# Patient Record
Sex: Male | Born: 1950 | Race: White | Hispanic: No | Marital: Married | Smoking: Current every day smoker
Health system: Southern US, Community
[De-identification: ages and names within clinical notes are randomized; demographics above are authoritative.]

## PROBLEM LIST (undated history)

## (undated) DIAGNOSIS — G4730 Sleep apnea, unspecified: Secondary | ICD-10-CM

## (undated) DIAGNOSIS — E785 Hyperlipidemia, unspecified: Secondary | ICD-10-CM

## (undated) DIAGNOSIS — F32A Depression, unspecified: Secondary | ICD-10-CM

## (undated) DIAGNOSIS — R0600 Dyspnea, unspecified: Secondary | ICD-10-CM

## (undated) DIAGNOSIS — F329 Major depressive disorder, single episode, unspecified: Secondary | ICD-10-CM

## (undated) DIAGNOSIS — I10 Essential (primary) hypertension: Secondary | ICD-10-CM

## (undated) DIAGNOSIS — E119 Type 2 diabetes mellitus without complications: Secondary | ICD-10-CM

## (undated) HISTORY — PX: APPENDECTOMY: SHX54

## (undated) HISTORY — PX: COLONOSCOPY: SHX174

## (undated) HISTORY — PX: HERNIA REPAIR: SHX51

---

## 2007-05-10 ENCOUNTER — Ambulatory Visit: Payer: Self-pay | Admitting: Gastroenterology

## 2008-12-25 ENCOUNTER — Emergency Department: Payer: Self-pay | Admitting: Emergency Medicine

## 2011-09-13 ENCOUNTER — Ambulatory Visit: Payer: Self-pay | Admitting: Gastroenterology

## 2011-09-15 LAB — PATHOLOGY REPORT

## 2014-03-26 DIAGNOSIS — C4492 Squamous cell carcinoma of skin, unspecified: Secondary | ICD-10-CM

## 2014-03-26 DIAGNOSIS — C4491 Basal cell carcinoma of skin, unspecified: Secondary | ICD-10-CM

## 2014-03-26 HISTORY — DX: Squamous cell carcinoma of skin, unspecified: C44.92

## 2014-03-26 HISTORY — DX: Basal cell carcinoma of skin, unspecified: C44.91

## 2016-11-10 ENCOUNTER — Ambulatory Visit
Admission: RE | Admit: 2016-11-10 | Discharge: 2016-11-10 | Disposition: A | Discharge status: Home / Self Care | Payer: Medicare Other | Source: Ambulatory Visit | Attending: Unknown Physician Specialty | Admitting: Unknown Physician Specialty

## 2016-11-10 ENCOUNTER — Ambulatory Visit: Payer: Medicare Other | Admitting: Anesthesiology

## 2016-11-10 ENCOUNTER — Encounter: Payer: Self-pay | Admitting: *Deleted

## 2016-11-10 ENCOUNTER — Encounter
Admission: RE | Disposition: A | Discharge status: Home / Self Care | Payer: Self-pay | Source: Ambulatory Visit | Attending: Unknown Physician Specialty

## 2016-11-10 DIAGNOSIS — Z1211 Encounter for screening for malignant neoplasm of colon: Secondary | ICD-10-CM | POA: Insufficient documentation

## 2016-11-10 DIAGNOSIS — F329 Major depressive disorder, single episode, unspecified: Secondary | ICD-10-CM | POA: Diagnosis not present

## 2016-11-10 DIAGNOSIS — D124 Benign neoplasm of descending colon: Secondary | ICD-10-CM | POA: Diagnosis not present

## 2016-11-10 DIAGNOSIS — D122 Benign neoplasm of ascending colon: Secondary | ICD-10-CM | POA: Insufficient documentation

## 2016-11-10 DIAGNOSIS — D123 Benign neoplasm of transverse colon: Secondary | ICD-10-CM | POA: Insufficient documentation

## 2016-11-10 DIAGNOSIS — F17210 Nicotine dependence, cigarettes, uncomplicated: Secondary | ICD-10-CM | POA: Insufficient documentation

## 2016-11-10 DIAGNOSIS — Z79899 Other long term (current) drug therapy: Secondary | ICD-10-CM | POA: Diagnosis not present

## 2016-11-10 DIAGNOSIS — Z9049 Acquired absence of other specified parts of digestive tract: Secondary | ICD-10-CM | POA: Diagnosis not present

## 2016-11-10 DIAGNOSIS — K648 Other hemorrhoids: Secondary | ICD-10-CM | POA: Diagnosis not present

## 2016-11-10 DIAGNOSIS — I10 Essential (primary) hypertension: Secondary | ICD-10-CM | POA: Insufficient documentation

## 2016-11-10 DIAGNOSIS — D125 Benign neoplasm of sigmoid colon: Secondary | ICD-10-CM | POA: Insufficient documentation

## 2016-11-10 DIAGNOSIS — E785 Hyperlipidemia, unspecified: Secondary | ICD-10-CM | POA: Diagnosis not present

## 2016-11-10 HISTORY — DX: Depression, unspecified: F32.A

## 2016-11-10 HISTORY — PX: COLONOSCOPY WITH PROPOFOL: SHX5780

## 2016-11-10 HISTORY — DX: Hyperlipidemia, unspecified: E78.5

## 2016-11-10 HISTORY — DX: Essential (primary) hypertension: I10

## 2016-11-10 HISTORY — DX: Major depressive disorder, single episode, unspecified: F32.9

## 2016-11-10 SURGERY — COLONOSCOPY WITH PROPOFOL
Anesthesia: General

## 2016-11-10 MED ORDER — PHENYLEPHRINE HCL 10 MG/ML IJ SOLN
INTRAMUSCULAR | Status: DC | PRN
  Administered 2016-11-10: 100 ug via INTRAVENOUS

## 2016-11-10 MED ORDER — SODIUM CHLORIDE 0.9 % IV SOLN: INTRAVENOUS | Status: DC

## 2016-11-10 MED ORDER — PROPOFOL 500 MG/50ML IV EMUL
INTRAVENOUS | Status: DC | PRN
  Administered 2016-11-10: 150 ug/kg/min via INTRAVENOUS

## 2016-11-10 MED ORDER — PROPOFOL 10 MG/ML IV BOLUS
INTRAVENOUS | Status: DC | PRN
  Administered 2016-11-10: 60 mg via INTRAVENOUS

## 2016-11-10 MED ORDER — MIDAZOLAM HCL 2 MG/2ML IJ SOLN
INTRAMUSCULAR | Status: DC | PRN
  Administered 2016-11-10: 1 mg via INTRAVENOUS

## 2016-11-10 MED ORDER — LIDOCAINE HCL (PF) 2 % IJ SOLN
INTRAMUSCULAR | Status: AC
  Filled 2016-11-10: qty 2

## 2016-11-10 MED ORDER — PROPOFOL 500 MG/50ML IV EMUL
INTRAVENOUS | Status: AC
  Filled 2016-11-10: qty 50

## 2016-11-10 MED ORDER — SODIUM CHLORIDE 0.9 % IV SOLN
INTRAVENOUS | Status: DC
  Administered 2016-11-10: 10:00:00 via INTRAVENOUS

## 2016-11-10 NOTE — Anesthesia Preprocedure Evaluation (Signed)
Anesthesia Evaluation  Patient identified by MRN, date of birth, ID band Patient awake    Reviewed: Allergy & Precautions, NPO status , Patient's Chart, lab work & pertinent test results  History of Anesthesia Complications Negative for: history of anesthetic complications  Airway Mallampati: II  TM Distance: >3 FB Neck ROM: Full    Dental no notable dental hx.    Pulmonary neg sleep apnea, neg COPD, Current Smoker,    breath sounds clear to auscultation- rhonchi (-) wheezing      Cardiovascular hypertension, Pt. on medications (-) CAD and (-) Past MI  Rhythm:Regular Rate:Normal - Systolic murmurs and - Diastolic murmurs    Neuro/Psych PSYCHIATRIC DISORDERS Depression negative neurological ROS     GI/Hepatic negative GI ROS, Neg liver ROS,   Endo/Other  negative endocrine ROSneg diabetes  Renal/GU negative Renal ROS     Musculoskeletal negative musculoskeletal ROS (+)   Abdominal (+) + obese,   Peds  Hematology negative hematology ROS (+)   Anesthesia Other Findings Past Medical History: No date: Depression No date: Hyperlipidemia No date: Hypertension   Reproductive/Obstetrics                             Anesthesia Physical Anesthesia Plan  ASA: II  Anesthesia Plan: General   Post-op Pain Management:    Induction: Intravenous  PONV Risk Score and Plan: 0 and Propofol  Airway Management Planned: Natural Airway  Additional Equipment:   Intra-op Plan:   Post-operative Plan:   Informed Consent: I have reviewed the patients History and Physical, chart, labs and discussed the procedure including the risks, benefits and alternatives for the proposed anesthesia with the patient or authorized representative who has indicated his/her understanding and acceptance.   Dental advisory given  Plan Discussed with: CRNA and Anesthesiologist  Anesthesia Plan Comments:          Anesthesia Quick Evaluation

## 2016-11-10 NOTE — Anesthesia Postprocedure Evaluation (Signed)
Anesthesia Post Note  Patient: Jerry Huynh  Procedure(s) Performed: Procedure(s) (LRB): COLONOSCOPY WITH PROPOFOL (N/A)  Patient location during evaluation: Endoscopy Anesthesia Type: General Level of consciousness: awake and alert and oriented Pain management: pain level controlled Vital Signs Assessment: post-procedure vital signs reviewed and stable Respiratory status: spontaneous breathing, nonlabored ventilation and respiratory function stable Cardiovascular status: blood pressure returned to baseline and stable Postop Assessment: no signs of nausea or vomiting Anesthetic complications: no     Last Vitals:  Vitals:   11/10/16 0937 11/10/16 1114  BP: 134/90 (!) 110/91  Pulse: 82 82  Resp: 16 18  Temp: 36.9 C (!) 35.8 C    Last Pain:  Vitals:   11/10/16 1114  TempSrc: Tympanic                 Stepehn Eckard

## 2016-11-10 NOTE — Transfer of Care (Signed)
Immediate Anesthesia Transfer of Care Note  Patient: Jerry Huynh  Procedure(s) Performed: Procedure(s): COLONOSCOPY WITH PROPOFOL (N/A)  Patient Location: PACU  Anesthesia Type:General  Level of Consciousness: awake and alert   Airway & Oxygen Therapy: Patient Spontanous Breathing and Patient connected to nasal cannula oxygen  Post-op Assessment: Report given to RN and Post -op Vital signs reviewed and stable  Post vital signs: Reviewed and stable  Last Vitals:  Vitals:   11/10/16 1114 11/10/16 1117  BP: (!) 110/91 (!) 110/91  Pulse: 82 72  Resp: 18 16  Temp: (!) 35.8 C     Last Pain:  Vitals:   11/10/16 1114  TempSrc: Tympanic         Complications: No apparent anesthesia complications

## 2016-11-10 NOTE — H&P (Signed)
   Primary Care Physician:  [name], MD Primary Gastroenterologist:  Dr. Vira Agar  Pre-Procedure History & Physical: HPI:  Jerry Huynh is a 66 y.o. male is here for an colonoscopy.   Past Medical History:  Diagnosis Date  . Depression   . Hyperlipidemia   . Hypertension     Past Surgical History:  Procedure Laterality Date  . APPENDECTOMY    . COLONOSCOPY    . HERNIA REPAIR      Prior to Admission medications   Medication Sig Start Date End Date Taking? Authorizing Provider  escitalopram (LEXAPRO) 10 MG tablet Take 10 mg by mouth daily.   Yes [provider]  lisinopril-hydrochlorothiazide (PRINZIDE,ZESTORETIC) 20-25 MG tablet Take 1 tablet by mouth daily.   Yes [provider]  metoprolol succinate (TOPROL-XL) 25 MG 24 hr tablet Take 25 mg by mouth daily.   Yes [provider]  Multiple Vitamin (MULTIVITAMIN) tablet Take 1 tablet by mouth daily.   Yes [provider]  Omega-3 1000 MG CAPS Take 1 capsule by mouth daily.   Yes [provider]  omeprazole (PRILOSEC) 20 MG capsule Take 20 mg by mouth daily.   Yes [provider]    Allergies as of 08/14/2016  . (Not on File)    History reviewed. No pertinent family history.  Social History   Social History  . Marital status: Married    Spouse name: N/A  . Number of children: N/A  . Years of education: N/A   Occupational History  . Not on file.   Social History Main Topics  . Smoking status: Current Every Day Smoker    Packs/day: 1.00    Types: Cigarettes  . Smokeless tobacco: Never Used  . Alcohol use Not on file     Comment: occassional  . Drug use: No  . Sexual activity: Not on file   Other Topics Concern  . Not on file   Social History Narrative  . No narrative on file    Review of Systems: See HPI, otherwise negative ROS  Physical Exam: BP 134/90   Pulse 82   Temp 98.4 F (36.9 C) (Tympanic)   Resp 16   Ht 6' (1.829 m)   Wt 136.1  kg (300 lb)   SpO2 97%   BMI 40.69 kg/m  General:   Alert,  pleasant and cooperative in NAD Head:  Normocephalic and atraumatic. Neck:  Supple; no masses or thyromegaly. Lungs:  Clear throughout to auscultation.    Heart:  Regular rate and rhythm. Abdomen:  Soft, nontender and nondistended. Normal bowel sounds, without guarding, and without rebound.   Neurologic:  Alert and  oriented x4;  grossly normal neurologically.  Impression/Plan: Jerry Huynh is here for an colonoscopy to be performed for [facility] colon polyps.  Risks, benefits, limitations, and alternatives regarding  colonoscopy have been reviewed with the patient.  Questions have been answered.  All parties agreeable.   Gaylyn Cheers, MD  11/10/2016, 10:08 AM

## 2016-11-10 NOTE — Op Note (Signed)
[facility] Gastroenterology Patient Name: Jerry Huynh Procedure Date: 11/10/2016 10:06 AM MRN: [id number] Account #: [id number] Date of Birth: 1950/07/22 Admit Type: Outpatient Age: 66 Room: [facility] ENDO ROOM 3 Gender: Male Note Status: Finalized Procedure:            Colonoscopy Indications:          High risk colon cancer surveillance: Personal history                        of colonic polyps Providers:            Manya Silvas, MD Referring MD:         Irven Easterly. Kary Kos, MD (Referring MD) Medicines:            Propofol per Anesthesia Complications:        No immediate complications. Procedure:            Pre-Anesthesia Assessment:                       - After reviewing the risks and benefits, the patient                        was deemed in satisfactory condition to undergo the                        procedure.                       After obtaining informed consent, the colonoscope was                        passed under direct vision. Throughout the procedure,                        the patient's blood pressure, pulse, and oxygen                        saturations were monitored continuously. The                        Colonoscope was introduced through the anus and                        advanced to the the cecum, identified by appendiceal                        orifice and ileocecal valve. The colonoscopy was                        somewhat difficult due to frequent coughing. The                        patient tolerated the procedure. The quality of the                        bowel preparation was good. Findings:      A small polyp was found in the descending colon. The polyp was sessile.       The polyp was removed with a hot snare. Resection and retrieval were       complete.      Four sessile polyps  were found in the ascending colon. The polyps were       small in size. These polyps were removed with a hot snare. Resection and       retrieval were  complete.      A diminutive polyp was found in the ascending colon. The polyp was       sessile. The polyp was removed with a jumbo cold forceps. Resection and       retrieval were complete.      A small polyp was found in the hepatic flexure. The polyp was sessile.       The polyp was removed with a cold snare. Resection and retrieval were       complete.      A diminutive polyp was found in the descending colon. The polyp was       sessile. The polyp was removed with a jumbo cold forceps. Resection and       retrieval were complete.      Four sessile polyps were found in the sigmoid colon. The polyps were       small in size. These polyps were removed with a cold snare. Resection       and retrieval were complete.      Internal hemorrhoids were found. The hemorrhoids were small and Grade I       (internal hemorrhoids that do not prolapse). Impression:           - One small polyp in the descending colon, removed with                        a hot snare. Resected and retrieved.                       - Four small polyps in the ascending colon, removed                        with a hot snare. Resected and retrieved.                       - One diminutive polyp in the ascending colon, removed                        with a jumbo cold forceps. Resected and retrieved.                       - One small polyp at the hepatic flexure, removed with                        a cold snare. Resected and retrieved.                       - One diminutive polyp in the descending colon, removed                        with a jumbo cold forceps. Resected and retrieved.                       - Four small polyps in the sigmoid colon, removed with                        a cold snare. Resected and retrieved.                       -  Internal hemorrhoids. Recommendation:       - Await pathology results. Manya Silvas, MD 11/10/2016 11:13:20 AM This report has been signed electronically. Number of Addenda:  0 Note Initiated On: 11/10/2016 10:06 AM Scope Withdrawal Time: 0 hours 18 minutes 8 seconds  Total Procedure Duration: 0 hours 36 minutes 4 seconds       [facility]

## 2016-11-10 NOTE — Anesthesia Procedure Notes (Addendum)
Performed by: Lance Muss Pre-anesthesia Checklist: Patient identified, Emergency Drugs available, Suction available, Timeout performed and Patient being monitored Patient Re-evaluated:Patient Re-evaluated prior to inductionOxygen Delivery Method: Nasal cannula Preoxygenation: Pre-oxygenation with 100% oxygen Intubation Type: IV induction Ventilation: Nasal airway inserted- appropriate to patient size

## 2016-11-10 NOTE — Anesthesia Post-op Follow-up Note (Cosign Needed)
Anesthesia QCDR form completed.        

## 2016-11-13 ENCOUNTER — Encounter: Payer: Self-pay | Admitting: Unknown Physician Specialty

## 2016-11-13 LAB — SURGICAL PATHOLOGY

## 2017-10-08 ENCOUNTER — Ambulatory Visit: Payer: Medicare Other | Admitting: Podiatry

## 2017-10-11 DIAGNOSIS — I10 Essential (primary) hypertension: Secondary | ICD-10-CM | POA: Insufficient documentation

## 2017-10-11 DIAGNOSIS — E785 Hyperlipidemia, unspecified: Secondary | ICD-10-CM | POA: Insufficient documentation

## 2017-10-12 ENCOUNTER — Ambulatory Visit (INDEPENDENT_AMBULATORY_CARE_PROVIDER_SITE_OTHER): Payer: Medicare Other | Admitting: Podiatry

## 2017-10-12 ENCOUNTER — Encounter: Payer: Self-pay | Admitting: Podiatry

## 2017-10-12 DIAGNOSIS — B351 Tinea unguium: Secondary | ICD-10-CM

## 2017-10-12 MED ORDER — TERBINAFINE HCL 250 MG PO TABS: 250.0000 mg | ORAL_TABLET | Freq: Every day | ORAL | 0 refills | Status: DC

## 2017-10-15 NOTE — Progress Notes (Signed)
   Subjective: 67 year old male presenting today as a new patient with possible fungal nails of bilateral feet that has been ongoing for the past several years. He reports some soreness of the nails with ambulating in shoes. He states he has been treated with Lamisil in the past which helped the symptoms but states they have now returned. He has tried OTC fungal gel with no significant relief. Patient is here for further evaluation and treatment.   Past Medical History:  Diagnosis Date  . Depression   . Hyperlipidemia   . Hypertension     Objective: Physical Exam General: The patient is alert and oriented x3 in no acute distress.  Dermatology: Hyperkeratotic, discolored, thickened, onychodystrophy of nails noted bilaterally. Skin is warm, dry and supple bilateral lower extremities. Negative for open lesions or macerations.  Vascular: Palpable pedal pulses bilaterally. No edema or erythema noted. Capillary refill within normal limits.  Neurological: Epicritic and protective threshold grossly intact bilaterally.   Musculoskeletal Exam: Range of motion within normal limits to all pedal and ankle joints bilateral. Muscle strength 5/5 in all groups bilateral.   Assessment: #1 onychomycosis nails 1-5 bilaterally  Plan of Care:  #1 Patient was evaluated. #2 Prescription for Lamisil 250 mg #90 provided to patient.  #3 Patient already had liver function test within the past year which was normal.  #4 Return to clinic as needed.    Edrick Kins, DPM [facility]  Dr. Edrick Kins, [place]                                        [place],  [id number]                Office [phone]  Fax [phone]

## 2018-02-12 ENCOUNTER — Other Ambulatory Visit: Payer: Self-pay

## 2018-02-12 ENCOUNTER — Emergency Department (HOSPITAL_COMMUNITY)
Admission: EM | Admit: 2018-02-12 | Discharge: 2018-02-12 | Disposition: A | Discharge status: Home / Self Care | Payer: Medicare Other | Attending: Emergency Medicine | Admitting: Emergency Medicine

## 2018-02-12 ENCOUNTER — Emergency Department (HOSPITAL_COMMUNITY): Payer: Medicare Other

## 2018-02-12 ENCOUNTER — Encounter (HOSPITAL_COMMUNITY): Payer: Self-pay

## 2018-02-12 DIAGNOSIS — M25572 Pain in left ankle and joints of left foot: Secondary | ICD-10-CM | POA: Diagnosis present

## 2018-02-12 DIAGNOSIS — Z5321 Procedure and treatment not carried out due to patient leaving prior to being seen by health care provider: Secondary | ICD-10-CM | POA: Insufficient documentation

## 2018-02-12 NOTE — ED Notes (Signed)
Pt stated that he was going to leave to see his daughter. Was offered ice pack, but declined. Pt has left [facility] ED at 14:11.

## 2018-02-12 NOTE — ED Provider Notes (Signed)
I did not seen this patient, he has left prior to evaluation.   Jerry Huynh 02/12/18 1423    Mesner, Corene Cornea, MD 02/12/18 [phone]

## 2018-02-12 NOTE — ED Triage Notes (Signed)
Pt states he missed a step and fell hurting his left ankle. Some swelling noted. Pt denies LOC or dizziness prior to falling. AOX4.

## 2018-02-12 NOTE — ED Notes (Signed)
Per Jerry Huynh report he was walking down the staircase from the 2nd floor to the 1st floor atrium. He missed the last step and injured his ankle.  His main complaint is pain and swelling of his left ankle. Per Jerry Huynh he did not trip on anything but simply missed the last step.  He also denied any dizziness or pre syncope.  Assisted to ED per request.  Wife at his side when he fell.

## 2018-02-12 NOTE — ED Notes (Signed)
Pt is expressing consideration to leave early due to his daughter having heart surgery today. RN Almyra Free notified. Pt is calm and cooperative at this time.

## 2020-02-02 ENCOUNTER — Ambulatory Visit (INDEPENDENT_AMBULATORY_CARE_PROVIDER_SITE_OTHER): Payer: Medicare Other | Admitting: Dermatology

## 2020-02-02 ENCOUNTER — Other Ambulatory Visit: Payer: Self-pay

## 2020-02-02 ENCOUNTER — Encounter: Payer: Self-pay | Admitting: Dermatology

## 2020-02-02 DIAGNOSIS — L570 Actinic keratosis: Secondary | ICD-10-CM | POA: Diagnosis not present

## 2020-02-02 DIAGNOSIS — D229 Melanocytic nevi, unspecified: Secondary | ICD-10-CM

## 2020-02-02 DIAGNOSIS — L720 Epidermal cyst: Secondary | ICD-10-CM

## 2020-02-02 DIAGNOSIS — L820 Inflamed seborrheic keratosis: Secondary | ICD-10-CM

## 2020-02-02 DIAGNOSIS — L719 Rosacea, unspecified: Secondary | ICD-10-CM

## 2020-02-02 DIAGNOSIS — Z86007 Personal history of in-situ neoplasm of skin: Secondary | ICD-10-CM

## 2020-02-02 DIAGNOSIS — Z1283 Encounter for screening for malignant neoplasm of skin: Secondary | ICD-10-CM

## 2020-02-02 DIAGNOSIS — L821 Other seborrheic keratosis: Secondary | ICD-10-CM

## 2020-02-02 DIAGNOSIS — Z85828 Personal history of other malignant neoplasm of skin: Secondary | ICD-10-CM

## 2020-02-02 DIAGNOSIS — L578 Other skin changes due to chronic exposure to nonionizing radiation: Secondary | ICD-10-CM

## 2020-02-02 NOTE — Patient Instructions (Addendum)

## 2020-02-02 NOTE — Progress Notes (Signed)
Follow-Up Visit   Subjective  Jerry Huynh is a 69 y.o. male who presents for the following: Follow-up (Patient here today for 6 month AK follow up at face, arms and hands. ). Patient does have some spots at arms and legs that are scaly. He does have a history of BCC and SCCis.  Patient had UBSE in January 2021. The patient presents for Upper Body Skin Exam (UBSE) for skin cancer screening and mole check.  The following portions of the chart were reviewed this encounter and updated as appropriate:  Tobacco  Allergies  Meds  Problems  Med Hx  Surg Hx  Fam Hx     Review of Systems:  No other skin or systemic complaints except as noted in HPI or Assessment and Plan.  Objective  Well appearing patient in no apparent distress; mood and affect are within normal limits.  A focused examination was performed including extremities, including the arms, hands, fingers, and fingernails and the legs, feet, toes, and toenails and face, neck, chest and back. Relevant physical exam findings are noted in the Assessment and Plan.  Objective  face: Dilated vessels  Objective  Left cheek: Smooth white papule(s).   Objective  Mid Dorsum Nose x 1, right ear x 3 (4): Erythematous thin papules/macules with gritty scale.    Assessment & Plan  Inflamed seborrheic keratosis (4) Left volar wrist x 1, right forearm x 1, left elbow x 1, right calf x 1  Destruction of lesion - Left volar wrist x 1, right forearm x 1, left elbow x 1, right calf x 1 Complexity: simple   Destruction method: cryotherapy   Informed consent: discussed and consent obtained   Timeout:  patient name, date of birth, surgical site, and procedure verified Lesion destroyed using liquid nitrogen: Yes   Region frozen until ice ball extended beyond lesion: Yes   Outcome: patient tolerated procedure well with no complications   Post-procedure details: wound care instructions given    Rosacea face Discussed laser treatment.      Milia Left cheek Sample of [place] given to patient to spot treat nightly.  Lot #[id number]   Exp: 12/2019  AK (actinic keratosis) (4) Mid Dorsum Nose x 1, right ear x 3  Destruction of lesion - Mid Dorsum Nose x 1, right ear x 3 Complexity: simple   Destruction method: cryotherapy   Informed consent: discussed and consent obtained   Timeout:  patient name, date of birth, surgical site, and procedure verified Lesion destroyed using liquid nitrogen: Yes   Region frozen until ice ball extended beyond lesion: Yes   Outcome: patient tolerated procedure well with no complications   Post-procedure details: wound care instructions given     Seborrheic Keratoses - Stuck-on, waxy, tan-brown papules and plaques  - Discussed benign etiology and prognosis. - Observe - Call for any changes  Actinic Damage - diffuse scaly erythematous macules with underlying dyspigmentation - Recommend daily broad spectrum sunscreen SPF 30+ to sun-exposed areas, reapply every 2 hours as needed.  - Call for new or changing lesions.  History of Basal Cell Carcinoma of the Skin - No evidence of recurrence today - Recommend regular full body skin exams - Recommend daily broad spectrum sunscreen SPF 30+ to sun-exposed areas, reapply every 2 hours as needed.  - Call if any new or changing lesions are noted between office visits  History of Squamous Cell Carcinoma in Situ of the Skin - No evidence of recurrence today - Recommend regular  full body skin exams - Recommend daily broad spectrum sunscreen SPF 30+ to sun-exposed areas, reapply every 2 hours as needed.  - Call if any new or changing lesions are noted between office visits  Melanocytic Nevi - Tan-brown and/or pink-flesh-colored symmetric macules and papules - Benign appearing on exam today - Observation - Call clinic for new or changing moles - Recommend daily use of broad spectrum spf 30+ sunscreen to sun-exposed areas.   Return in about 1  year (around 02/01/2021) for UBSE.  Graciella Belton, RMA, am acting as scribe for Sarina Ser, MD . Documentation: I have reviewed the above documentation for accuracy and completeness, and I agree with the above.  Sarina Ser, MD

## 2020-07-05 ENCOUNTER — Other Ambulatory Visit
Admission: RE | Admit: 2020-07-05 | Discharge: 2020-07-05 | Disposition: A | Discharge status: Home / Self Care | Payer: Medicare Other | Source: Ambulatory Visit | Attending: Internal Medicine | Admitting: Internal Medicine

## 2020-07-05 ENCOUNTER — Other Ambulatory Visit: Payer: Self-pay

## 2020-07-05 DIAGNOSIS — Z01812 Encounter for preprocedural laboratory examination: Secondary | ICD-10-CM | POA: Diagnosis present

## 2020-07-05 DIAGNOSIS — Z20822 Contact with and (suspected) exposure to covid-19: Secondary | ICD-10-CM | POA: Insufficient documentation

## 2020-07-05 LAB — SARS CORONAVIRUS 2 (TAT 6-24 HRS): SARS Coronavirus 2: NEGATIVE

## 2020-07-06 ENCOUNTER — Encounter: Payer: Self-pay | Admitting: Internal Medicine

## 2020-07-07 ENCOUNTER — Ambulatory Visit: Payer: Medicare Other | Admitting: Anesthesiology

## 2020-07-07 ENCOUNTER — Other Ambulatory Visit: Payer: Self-pay

## 2020-07-07 ENCOUNTER — Encounter: Payer: Self-pay | Admitting: Internal Medicine

## 2020-07-07 ENCOUNTER — Ambulatory Visit
Admission: RE | Admit: 2020-07-07 | Discharge: 2020-07-07 | Disposition: A | Discharge status: Home / Self Care | Payer: Medicare Other | Attending: Internal Medicine | Admitting: Internal Medicine

## 2020-07-07 ENCOUNTER — Encounter
Admission: RE | Disposition: A | Discharge status: Home / Self Care | Payer: Self-pay | Source: Home / Self Care | Attending: Internal Medicine

## 2020-07-07 DIAGNOSIS — E119 Type 2 diabetes mellitus without complications: Secondary | ICD-10-CM | POA: Diagnosis not present

## 2020-07-07 DIAGNOSIS — D122 Benign neoplasm of ascending colon: Secondary | ICD-10-CM | POA: Insufficient documentation

## 2020-07-07 DIAGNOSIS — Z86010 Personal history of colonic polyps: Secondary | ICD-10-CM | POA: Diagnosis not present

## 2020-07-07 DIAGNOSIS — Z885 Allergy status to narcotic agent status: Secondary | ICD-10-CM | POA: Insufficient documentation

## 2020-07-07 DIAGNOSIS — Z85828 Personal history of other malignant neoplasm of skin: Secondary | ICD-10-CM | POA: Diagnosis not present

## 2020-07-07 DIAGNOSIS — Z1211 Encounter for screening for malignant neoplasm of colon: Secondary | ICD-10-CM | POA: Insufficient documentation

## 2020-07-07 DIAGNOSIS — D125 Benign neoplasm of sigmoid colon: Secondary | ICD-10-CM | POA: Diagnosis not present

## 2020-07-07 DIAGNOSIS — E785 Hyperlipidemia, unspecified: Secondary | ICD-10-CM | POA: Diagnosis not present

## 2020-07-07 DIAGNOSIS — I10 Essential (primary) hypertension: Secondary | ICD-10-CM | POA: Diagnosis not present

## 2020-07-07 DIAGNOSIS — Z7984 Long term (current) use of oral hypoglycemic drugs: Secondary | ICD-10-CM | POA: Diagnosis not present

## 2020-07-07 DIAGNOSIS — Z79899 Other long term (current) drug therapy: Secondary | ICD-10-CM | POA: Insufficient documentation

## 2020-07-07 DIAGNOSIS — K5730 Diverticulosis of large intestine without perforation or abscess without bleeding: Secondary | ICD-10-CM | POA: Diagnosis not present

## 2020-07-07 DIAGNOSIS — Z882 Allergy status to sulfonamides status: Secondary | ICD-10-CM | POA: Insufficient documentation

## 2020-07-07 DIAGNOSIS — D123 Benign neoplasm of transverse colon: Secondary | ICD-10-CM | POA: Diagnosis not present

## 2020-07-07 HISTORY — PX: COLONOSCOPY WITH PROPOFOL: SHX5780

## 2020-07-07 HISTORY — DX: Type 2 diabetes mellitus without complications: E11.9

## 2020-07-07 LAB — GLUCOSE, CAPILLARY
Glucose-Capillary: 221 mg/dL — ABNORMAL HIGH (ref 70–99)
Glucose-Capillary: 181 mg/dL — ABNORMAL HIGH (ref 70–99)

## 2020-07-07 SURGERY — COLONOSCOPY WITH PROPOFOL
Anesthesia: General

## 2020-07-07 MED ORDER — PROPOFOL 10 MG/ML IV BOLUS
INTRAVENOUS | Status: AC
  Filled 2020-07-07: qty 20

## 2020-07-07 MED ORDER — PROPOFOL 500 MG/50ML IV EMUL
INTRAVENOUS | Status: AC
  Filled 2020-07-07: qty 50

## 2020-07-07 MED ORDER — PHENYLEPHRINE HCL (PRESSORS) 10 MG/ML IV SOLN
INTRAVENOUS | Status: DC | PRN
  Administered 2020-07-07 (×3): 100 ug via INTRAVENOUS

## 2020-07-07 MED ORDER — PROPOFOL 10 MG/ML IV BOLUS
INTRAVENOUS | Status: DC | PRN
  Administered 2020-07-07: 30 mg via INTRAVENOUS
  Administered 2020-07-07: 50 mg via INTRAVENOUS
  Administered 2020-07-07: 20 mg via INTRAVENOUS

## 2020-07-07 MED ORDER — DEXMEDETOMIDINE (PRECEDEX) IN NS 20 MCG/5ML (4 MCG/ML) IV SYRINGE
PREFILLED_SYRINGE | INTRAVENOUS | Status: DC | PRN
  Administered 2020-07-07: 8 ug via INTRAVENOUS
  Administered 2020-07-07: 12 ug via INTRAVENOUS

## 2020-07-07 MED ORDER — SODIUM CHLORIDE 0.9 % IV SOLN
INTRAVENOUS | Status: DC
  Administered 2020-07-07: 1000 mL via INTRAVENOUS

## 2020-07-07 MED ORDER — PROPOFOL 500 MG/50ML IV EMUL
INTRAVENOUS | Status: DC | PRN
  Administered 2020-07-07: 130 ug/kg/min via INTRAVENOUS

## 2020-07-07 MED ORDER — EPHEDRINE SULFATE 50 MG/ML IJ SOLN
INTRAMUSCULAR | Status: DC | PRN
  Administered 2020-07-07: 10 mg via INTRAVENOUS

## 2020-07-07 NOTE — Anesthesia Postprocedure Evaluation (Signed)
Anesthesia Post Note  Patient: Jerry Huynh  Procedure(s) Performed: COLONOSCOPY WITH PROPOFOL (N/A )  Patient location during evaluation: Endoscopy Anesthesia Type: General Level of consciousness: awake and alert Pain management: pain level controlled Vital Signs Assessment: post-procedure vital signs reviewed and stable Respiratory status: spontaneous breathing, nonlabored ventilation, respiratory function stable and patient connected to nasal cannula oxygen Cardiovascular status: blood pressure returned to baseline and stable Postop Assessment: no apparent nausea or vomiting Anesthetic complications: no   No complications documented.   Last Vitals:  Vitals:   07/07/20 0944 07/07/20 0952  BP: (!) 79/44 (!) 84/47  Pulse:    Resp:    Temp: (!) 36.2 C   SpO2:      Last Pain:  Vitals:   07/07/20 0944  TempSrc: Temporal  PainSc: Asleep                 Arita Miss

## 2020-07-07 NOTE — Anesthesia Procedure Notes (Signed)
Procedure Name: MAC Date/Time: 07/07/2020 9:11 AM Performed by: Lerry Liner, CRNA Pre-anesthesia Checklist: Patient identified, Emergency Drugs available, Suction available and Patient being monitored Patient Re-evaluated:Patient Re-evaluated prior to induction Oxygen Delivery Method: Supernova nasal CPAP

## 2020-07-07 NOTE — OR Nursing (Addendum)
BP REMAINS LOW POST OP.85/64. DR Addison Bailey  MADE AWARE AND ADM EPHEDRINE  5MG  IV . PT DENIES ANY DIZZINESS

## 2020-07-07 NOTE — Anesthesia Preprocedure Evaluation (Addendum)
Anesthesia Evaluation  Patient identified by MRN, date of birth, ID band Patient awake    Reviewed: Allergy & Precautions, NPO status , Patient's Chart, lab work & pertinent test results  History of Anesthesia Complications Negative for: history of anesthetic complications  Airway Mallampati: III  TM Distance: >3 FB Neck ROM: Full    Dental  (+) Poor Dentition   Pulmonary shortness of breath and with exertion, neg sleep apnea, neg COPD, Current SmokerPatient did not abstain from smoking.,    Pulmonary exam normal breath sounds clear to auscultation       Cardiovascular Exercise Tolerance: Poor METShypertension, (-) CAD and (-) Past MI (-) dysrhythmias  Rhythm:Regular Rate:Normal - Systolic murmurs TTE: INTERPRETATION  NORMAL LEFT VENTRICULAR SYSTOLIC FUNCTION  WITH MILD LVH  NORMAL RIGHT VENTRICULAR SYSTOLIC FUNCTION  TRIVIAL REGURGITATION NOTED (See above)  NO VALVULAR STENOSIS  SCLEROTIC AoV  TRIVIAL MR, TR  EF >55%  Morphology: MILDLY THICKENED  Closest EF: >55% (Estimated)  LVH: MILD LVH  Mitral: TRIVIAL MR  Tricuspid: TRIVIAL TR    Neuro/Psych PSYCHIATRIC DISORDERS Depression negative neurological ROS     GI/Hepatic neg GERD  ,(+)     (-) substance abuse  ,   Endo/Other  diabetesMorbid obesity  Renal/GU negative Renal ROS     Musculoskeletal   Abdominal (+) + obese,   Peds  Hematology   Anesthesia Other Findings Past Medical History: 03/26/2014: Basal cell carcinoma     Comment:  Left upper back post. shoulder inf. Superficial. No date: Depression No date: Diabetes mellitus without complication (HCC) No date: Hyperlipidemia No date: Hypertension 03/26/2014: Squamous cell carcinoma of skin     Comment:  Left upper back post. shoulder sup. SCCis arising in LK.  Reproductive/Obstetrics                            Anesthesia Physical Anesthesia Plan  ASA:  III  Anesthesia Plan: General   Post-op Pain Management:    Induction: Intravenous  PONV Risk Score and Plan: 1 and Ondansetron, Propofol infusion and TIVA  Airway Management Planned: Nasal Cannula  Additional Equipment: None  Intra-op Plan:   Post-operative Plan:   Informed Consent: I have reviewed the patients History and Physical, chart, labs and discussed the procedure including the risks, benefits and alternatives for the proposed anesthesia with the patient or authorized representative who has indicated his/her understanding and acceptance.     Dental advisory given  Plan Discussed with: CRNA and Surgeon  Anesthesia Plan Comments: (Discussed risks of anesthesia with patient, including possibility of difficulty with spontaneous ventilation under anesthesia necessitating airway intervention, PONV, and rare risks such as cardiac or respiratory or neurological events. Patient understands.  During last colonoscopy patient required nasal airway)        Anesthesia Quick Evaluation

## 2020-07-07 NOTE — Op Note (Signed)
[facility] Gastroenterology Patient Name: Jerry Huynh Procedure Date: 07/07/2020 9:04 AM MRN: [id number] Account #: [id number] Date of Birth: January 17, 1951 Admit Type: Outpatient Age: 70 Room: [facility] ENDO ROOM 2 Gender: Male Note Status: Finalized Procedure:             Colonoscopy Indications:           High risk colon cancer surveillance: Personal history                         of multiple (3 or more) adenomas Providers:             Lorie Apley K. Denario Bagot MD, MD Medicines:             Propofol per Anesthesia Complications:         No immediate complications. Procedure:             Pre-Anesthesia Assessment:                        - The risks and benefits of the procedure and the                         sedation options and risks were discussed with the                         patient. All questions were answered and informed                         consent was obtained.                        - Patient identification and proposed procedure were                         verified prior to the procedure by the nurse. The                         procedure was verified in the procedure room.                        - ASA Grade Assessment: III - A patient with severe                         systemic disease.                        - After reviewing the risks and benefits, the patient                         was deemed in satisfactory condition to undergo the                         procedure.                        After obtaining informed consent, the colonoscope was                         passed under direct vision. Throughout the procedure,  the patient's blood pressure, pulse, and oxygen                         saturations were monitored continuously. The                         Colonoscope was introduced through the anus and                         advanced to the the cecum, identified by appendiceal                         orifice and ileocecal  valve. The colonoscopy was                         performed without difficulty. The patient tolerated                         the procedure well. The quality of the bowel                         preparation was adequate. The ileocecal valve,                         appendiceal orifice, and rectum were photographed. Findings:      The perianal and digital rectal examinations were normal. Pertinent       negatives include normal sphincter tone and no palpable rectal lesions.      A 5 mm polyp was found in the transverse colon. The polyp was sessile.       The polyp was removed with a jumbo cold forceps. Resection and retrieval       were complete.      A 7 mm polyp was found in the ascending colon. The polyp was sessile. To       prevent bleeding after the polypectomy, one hemostatic clip was       successfully placed (MR conditional). There was no bleeding during, or       at the end, of the procedure.      Two sessile polyps were found in the proximal transverse colon. The       polyps were 3 to 4 mm in size. These polyps were removed with a jumbo       cold forceps. Resection and retrieval were complete.      Two sessile polyps were found in the sigmoid colon. The polyps were 3 to       5 mm in size. These polyps were removed with a jumbo cold forceps.       Resection and retrieval were complete.      Three sessile polyps were found in the rectum. The polyps were       diminutive in size. These polyps were removed with a cold biopsy       forceps. Resection and retrieval were complete.      Many small-mouthed diverticula were found in the left colon.      The exam was otherwise without abnormality on direct and retroflexion       views. Impression:            - One 5 mm polyp in the transverse colon, removed with  a jumbo cold forceps. Resected and retrieved.                        - One 7 mm polyp in the ascending colon. Clip (MR                          conditional) was placed.                        - Two 3 to 4 mm polyps in the proximal transverse                         colon, removed with a jumbo cold forceps. Resected and                         retrieved.                        - Two 3 to 5 mm polyps in the sigmoid colon, removed                         with a jumbo cold forceps. Resected and retrieved.                        - Three diminutive polyps in the rectum, removed with                         a cold biopsy forceps. Resected and retrieved.                        - Diverticulosis in the left colon.                        - The examination was otherwise normal on direct and                         retroflexion views. Recommendation:        - Patient has a contact number available for                         emergencies. The signs and symptoms of potential                         delayed complications were discussed with the patient.                         Return to normal activities tomorrow. Written                         discharge instructions were provided to the patient.                        - Resume previous diet.                        - Continue present medications.                        - Repeat colonoscopy is recommended for  surveillance.                         The colonoscopy date will be determined after                         pathology results from today's exam become available                         for review.                        - Return to GI office PRN.                        - The findings and recommendations were discussed with                         the patient. Procedure Code(s):     --- Professional ---                        [phone], Colonoscopy, flexible; with biopsy, single or                         multiple Diagnosis Code(s):     --- Professional ---                        K57.30, Diverticulosis of large intestine without                         perforation or abscess without bleeding                         K62.1, Rectal polyp                        K63.5, Polyp of colon                        Z86.010, Personal history of colonic polyps CPT copyright 2019 American [employer] Association. All rights reserved. The codes documented in this report are preliminary and upon coder review may  be revised to meet current compliance requirements. Efrain Sella MD, MD 07/07/2020 9:50:53 AM This report has been signed electronically. Number of Addenda: 0 Note Initiated On: 07/07/2020 9:04 AM Scope Withdrawal Time: 0 hours 11 minutes 53 seconds  Total Procedure Duration: 0 hours 19 minutes 0 seconds  Estimated Blood Loss:  Estimated blood loss: none.      [facility]

## 2020-07-07 NOTE — H&P (Signed)
Outpatient short stay form Pre-procedure 07/07/2020 9:03 AM Jerry Huynh, M.D.  Primary Physician: Jerry Huynh, M.D.  Reason for visit: Pesonal history of multiple adenomatous colon polyps - June 2018  History of present illness:                           Patient presents for colonoscopy for a personal hx of colon polyps. The patient denies abdominal pain, abnormal weight loss or rectal bleeding.      Current Facility-Administered Medications:  .  0.9 %  sodium chloride infusion, , Intravenous, Continuous, Herminie, Benay Pike, MD, Last Rate: 20 mL/hr at 07/07/20 0823, 1,000 mL at 07/07/20 3300  Medications Prior to Admission  Medication Sig Dispense Refill Last Dose  . cetirizine (ZYRTEC) 10 MG tablet Take 10 mg by mouth daily.   07/07/2020 at 0500  . escitalopram (LEXAPRO) 10 MG tablet Take 10 mg by mouth daily.   07/06/2020 at Unknown time  . gabapentin (NEURONTIN) 100 MG capsule Take 100 mg by mouth 3 (three) times daily.   Past Week at Unknown time  . glipiZIDE (GLUCOTROL XL) 5 MG 24 hr tablet Take by mouth.   Past Week at Unknown time  . lisinopril-hydrochlorothiazide (PRINZIDE,ZESTORETIC) 20-25 MG tablet Take 1 tablet by mouth daily.   07/07/2020 at 0500  . metoprolol succinate (TOPROL-XL) 25 MG 24 hr tablet Take 25 mg by mouth daily.   07/06/2020 at Unknown time  . Multiple Vitamin (MULTIVITAMIN) tablet Take 1 tablet by mouth daily.   Past Week at Unknown time  . Omega-3 1000 MG CAPS Take 1 capsule by mouth daily.   Past Week at Unknown time  . omeprazole (PRILOSEC) 20 MG capsule Take 20 mg by mouth daily.   Past Week at Unknown time  . terbinafine (LAMISIL) 250 MG tablet Take 1 tablet (250 mg total) by mouth daily. (Patient not taking: Reported on 07/07/2020) 90 tablet 0 Not Taking at Unknown time     Allergies  Allergen Reactions  . Sulfa Antibiotics Anaphylaxis  . Codeine Nausea And Vomiting     Past Medical History:  Diagnosis Date  . Basal cell carcinoma 03/26/2014    Left upper back post. shoulder inf. Superficial.  . Depression   . Diabetes mellitus without complication ([place])   . Hyperlipidemia   . Hypertension   . Squamous cell carcinoma of skin 03/26/2014   Left upper back post. shoulder sup. SCCis arising in LK.    Review of systems:  Otherwise negative.    Physical Exam  Gen: Alert, oriented. Appears stated age.  HEENT: [place]/AT. PERRLA. Lungs: CTA, no wheezes. CV: RR nl S1, S2. Abd: soft, benign, no masses. BS+ Ext: No edema. Pulses 2+    Planned procedures: Proceed with colonoscopy. The patient understands the nature of the planned procedure, indications, risks, alternatives and potential complications including but not limited to bleeding, infection, perforation, damage to internal organs and possible oversedation/side effects from anesthesia. The patient agrees and gives consent to proceed.  Please refer to procedure notes for findings, recommendations and patient disposition/instructions.     Jerry Huynh, M.D. Gastroenterology 07/07/2020  9:03 AM

## 2020-07-07 NOTE — Interval H&P Note (Signed)
History and Physical Interval Note:  07/07/2020 9:04 AM  Jerry Huynh  has presented today for surgery, with the diagnosis of HX ADEN POLYPS.  The various methods of treatment have been discussed with the patient and family. After consideration of risks, benefits and other options for treatment, the patient has consented to  Procedure(s) with comments: COLONOSCOPY WITH PROPOFOL (N/A) - DM as a surgical intervention.  The patient's history has been reviewed, patient examined, no change in status, stable for surgery.  I have reviewed the patient's chart and labs.  Questions were answered to the patient's satisfaction.     [place], [place]

## 2020-07-07 NOTE — OR Nursing (Addendum)
BP 82/62 2O MINUTES AFTER LAST EPHEDRINE. DR Addison Bailey  ADM 5MG  MORE OF EPHEDRINE. WILL CONTINUE TO MONITOR. PT IS ASYMPTOMATIC

## 2020-07-07 NOTE — Transfer of Care (Signed)
Immediate Anesthesia Transfer of Care Note  Patient: Jerry Huynh  Procedure(s) Performed: COLONOSCOPY WITH PROPOFOL (N/A )  Patient Location: PACU and Endoscopy Unit  Anesthesia Type:General  Level of Consciousness: drowsy and patient cooperative  Airway & Oxygen Therapy: Patient Spontanous Breathing and Patient connected to face mask oxygen  Post-op Assessment: Report given to RN and Post -op Vital signs reviewed and stable  Post vital signs: Reviewed and stable  Last Vitals:  Vitals Value Taken Time  BP 79/44 07/07/20 0945  Temp 36.2 C 07/07/20 0944  Pulse 75 07/07/20 0946  Resp 30 07/07/20 0946  SpO2 100 % 07/07/20 0946  Vitals shown include unvalidated device data.  Last Pain:  Vitals:   07/07/20 0944  TempSrc: Temporal  PainSc: Asleep         Complications: No complications documented.

## 2020-07-08 ENCOUNTER — Encounter: Payer: Self-pay | Admitting: Internal Medicine

## 2020-07-08 LAB — SURGICAL PATHOLOGY

## 2020-08-15 ENCOUNTER — Emergency Department: Payer: Medicare Other

## 2020-08-15 ENCOUNTER — Emergency Department
Admission: EM | Admit: 2020-08-15 | Discharge: 2020-08-15 | Disposition: A | Discharge status: Home / Self Care | Payer: Medicare Other | Attending: Emergency Medicine | Admitting: Emergency Medicine

## 2020-08-15 ENCOUNTER — Other Ambulatory Visit: Payer: Self-pay

## 2020-08-15 DIAGNOSIS — I10 Essential (primary) hypertension: Secondary | ICD-10-CM | POA: Diagnosis not present

## 2020-08-15 DIAGNOSIS — R42 Dizziness and giddiness: Secondary | ICD-10-CM | POA: Insufficient documentation

## 2020-08-15 DIAGNOSIS — Z7984 Long term (current) use of oral hypoglycemic drugs: Secondary | ICD-10-CM | POA: Diagnosis not present

## 2020-08-15 DIAGNOSIS — Z79899 Other long term (current) drug therapy: Secondary | ICD-10-CM | POA: Insufficient documentation

## 2020-08-15 DIAGNOSIS — F17210 Nicotine dependence, cigarettes, uncomplicated: Secondary | ICD-10-CM | POA: Diagnosis not present

## 2020-08-15 DIAGNOSIS — R519 Headache, unspecified: Secondary | ICD-10-CM | POA: Diagnosis present

## 2020-08-15 DIAGNOSIS — E119 Type 2 diabetes mellitus without complications: Secondary | ICD-10-CM | POA: Insufficient documentation

## 2020-08-15 DIAGNOSIS — Z85828 Personal history of other malignant neoplasm of skin: Secondary | ICD-10-CM | POA: Insufficient documentation

## 2020-08-15 DIAGNOSIS — R0602 Shortness of breath: Secondary | ICD-10-CM | POA: Insufficient documentation

## 2020-08-15 DIAGNOSIS — H532 Diplopia: Secondary | ICD-10-CM | POA: Diagnosis not present

## 2020-08-15 LAB — C-REACTIVE PROTEIN: CRP: 0.8 mg/dL (ref <1.0)

## 2020-08-15 LAB — CBG MONITORING, ED: Glucose-Capillary: 169 mg/dL — ABNORMAL HIGH (ref 70–99)

## 2020-08-15 LAB — HEPATIC FUNCTION PANEL
ALT: 31 U/L (ref 0–44)
AST: 29 U/L (ref 15–41)
Albumin: 4.2 g/dL (ref 3.5–5.0)
Alkaline Phosphatase: 57 U/L (ref 38–126)
Bilirubin, Direct: 0.1 mg/dL (ref 0.0–0.2)
Indirect Bilirubin: 0.6 mg/dL (ref 0.3–0.9)
Total Bilirubin: 0.7 mg/dL (ref 0.3–1.2)
Total Protein: 7.4 g/dL (ref 6.5–8.1)

## 2020-08-15 LAB — CBC
HCT: 41.2 % (ref 39.0–52.0)
Hemoglobin: 14.7 g/dL (ref 13.0–17.0)
MCH: 32.5 pg (ref 26.0–34.0)
MCHC: 35.7 g/dL (ref 30.0–36.0)
MCV: 91.2 fL (ref 80.0–100.0)
Platelets: 235 10*3/uL (ref 150–400)
RBC: 4.52 MIL/uL (ref 4.22–5.81)
RDW: 11.7 % (ref 11.5–15.5)
WBC: 7.5 10*3/uL (ref 4.0–10.5)
nRBC: 0 % (ref 0.0–0.2)

## 2020-08-15 LAB — BASIC METABOLIC PANEL
Anion gap: 9 (ref 5–15)
BUN: 20 mg/dL (ref 8–23)
CO2: 22 mmol/L (ref 22–32)
Calcium: 9.2 mg/dL (ref 8.9–10.3)
Chloride: 105 mmol/L (ref 98–111)
Creatinine, Ser: 1.14 mg/dL (ref 0.61–1.24)
GFR, Estimated: >60 mL/min (ref >60)
Glucose, Bld: 189 mg/dL — ABNORMAL HIGH (ref 70–99)
Potassium: 3.9 mmol/L (ref 3.5–5.1)
Sodium: 136 mmol/L (ref 135–145)

## 2020-08-15 LAB — SEDIMENTATION RATE: Sed Rate: 10 mm/hr (ref 0–20)

## 2020-08-15 MED ORDER — SODIUM CHLORIDE 0.9 % IV BOLUS
1000.0000 mL | Freq: Once | INTRAVENOUS | Status: AC
Start: 2020-08-15 — End: 2020-08-15
  Administered 2020-08-15: 1000 mL via INTRAVENOUS

## 2020-08-15 MED ORDER — PROCHLORPERAZINE EDISYLATE 10 MG/2ML IJ SOLN
10.0000 mg | Freq: Once | INTRAMUSCULAR | Status: AC
  Administered 2020-08-15: 10 mg via INTRAVENOUS
  Filled 2020-08-15: qty 2

## 2020-08-15 MED ORDER — MORPHINE SULFATE (PF) 4 MG/ML IV SOLN
4.0000 mg | Freq: Once | INTRAVENOUS | Status: AC
  Administered 2020-08-15: 4 mg via INTRAVENOUS
  Filled 2020-08-15: qty 1

## 2020-08-15 MED ORDER — ONDANSETRON HCL 4 MG/2ML IJ SOLN
4.0000 mg | Freq: Once | INTRAMUSCULAR | Status: AC
  Administered 2020-08-15: 4 mg via INTRAVENOUS
  Filled 2020-08-15: qty 2

## 2020-08-15 MED ORDER — BUTALBITAL-APAP-CAFFEINE 50-325-40 MG PO TABS
2.0000 | ORAL_TABLET | Freq: Once | ORAL | Status: AC
  Administered 2020-08-15: 2 via ORAL
  Filled 2020-08-15: qty 2

## 2020-08-15 MED ORDER — GADOBUTROL 1 MMOL/ML IV SOLN
10.0000 mL | Freq: Once | INTRAVENOUS | Status: AC | PRN
  Administered 2020-08-15: 10 mL via INTRAVENOUS

## 2020-08-15 MED ORDER — KETOROLAC TROMETHAMINE 30 MG/ML IJ SOLN
15.0000 mg | Freq: Once | INTRAMUSCULAR | Status: AC
  Administered 2020-08-15: 15 mg via INTRAVENOUS
  Filled 2020-08-15: qty 1

## 2020-08-15 NOTE — Discharge Instructions (Addendum)
Follow up with your regular eye doctor if not improving in 3 days, return to the ER if worsening Drink plenty of fluids Take tylenol and small amount of ibuprofen for pain as needed

## 2020-08-15 NOTE — ED Provider Notes (Signed)
[facility] Emergency Department Provider Note  ____________________________________________   Event Date/Time   First MD Initiated Contact with Patient 08/15/20 1309     (approximate)  I have reviewed the triage vital signs and the nursing notes.   HISTORY  Chief Complaint Headache, Dizziness, and Blurred Vision    HPI Jerry Huynh is a 70 y.o. male [facility] emergency department complaining of right-sided headache with double vision.  Pain behind his right eye.  Some shortness of breath and dizziness that started on Friday.  No history of previous stroke.  Patient does not take aspirin a day or any blood thinners.  Patient had a colonoscopy 3 weeks ago.  States they have to give him epinephrine to get his blood pressure up at that time.    Past Medical History:  Diagnosis Date  . Basal cell carcinoma 03/26/2014   Left upper back post. shoulder inf. Superficial.  . Depression   . Diabetes mellitus without complication ([place])   . Hyperlipidemia   . Hypertension   . Squamous cell carcinoma of skin 03/26/2014   Left upper back post. shoulder sup. SCCis arising in LK.    Patient Active Problem List   Diagnosis Date Noted  . Hyperlipidemia, unspecified 10/11/2017  . Hypertension 10/11/2017    Past Surgical History:  Procedure Laterality Date  . APPENDECTOMY    . COLONOSCOPY    . COLONOSCOPY WITH PROPOFOL N/A 11/10/2016   Procedure: COLONOSCOPY WITH PROPOFOL;  Surgeon: Manya Silvas, MD;  Location: [facility] ENDOSCOPY;  Service: Endoscopy;  Laterality: N/A;  . COLONOSCOPY WITH PROPOFOL N/A 07/07/2020   Procedure: COLONOSCOPY WITH PROPOFOL;  Surgeon: Toledo, Benay Pike, MD;  Location: [facility] ENDOSCOPY;  Service: Gastroenterology;  Laterality: N/A;  DM  . HERNIA REPAIR      Prior to Admission medications   Medication Sig Start Date End Date Taking? Authorizing Provider  cetirizine (ZYRTEC) 10 MG tablet Take 10 mg by mouth daily.    [provider]   escitalopram (LEXAPRO) 10 MG tablet Take 10 mg by mouth daily.    [provider]  gabapentin (NEURONTIN) 100 MG capsule Take 100 mg by mouth 3 (three) times daily.    [provider]  glipiZIDE (GLUCOTROL XL) 5 MG 24 hr tablet Take by mouth. 04/09/18   [provider]  lisinopril-hydrochlorothiazide (PRINZIDE,ZESTORETIC) 20-25 MG tablet Take 1 tablet by mouth daily.    [provider]  metoprolol succinate (TOPROL-XL) 25 MG 24 hr tablet Take 25 mg by mouth daily.    [provider]  Multiple Vitamin (MULTIVITAMIN) tablet Take 1 tablet by mouth daily.    [provider]  Omega-3 1000 MG CAPS Take 1 capsule by mouth daily.    [provider]  omeprazole (PRILOSEC) 20 MG capsule Take 20 mg by mouth daily.    [provider]  terbinafine (LAMISIL) 250 MG tablet Take 1 tablet (250 mg total) by mouth daily. Patient not taking: Reported on 07/07/2020 10/12/17   Edrick Kins, DPM    Allergies Sulfa antibiotics and Codeine  No family history on file.  Social History Social History   Tobacco Use  . Smoking status: Current Every Day Smoker    Packs/day: 1.00    Types: Cigarettes  . Smokeless tobacco: Never Used  Vaping Use  . Vaping Use: Never used  Substance Use Topics  . Drug use: No    Review of Systems  Constitutional: No fever/chills, complains of some dizziness Eyes: Positive  visual changes ENT: No sore throat. Respiratory: Denies cough, complains of some shortness of breath Cardiovascular: Denies chest pain Gastrointestinal: Denies abdominal pain Genitourinary: Negative for dysuria. Musculoskeletal: Negative for back pain. Skin: Negative for rash. Psychiatric: no mood changes,     ____________________________________________   PHYSICAL EXAM:  VITAL SIGNS: ED Triage Vitals  Enc Vitals Group     BP 08/15/20 1145 (!) 152/89     Pulse Rate 08/15/20 1145 76     Resp 08/15/20 1145 18     Temp  08/15/20 1145 98.4 F (36.9 C)     Temp Source 08/15/20 1145 Oral     SpO2 08/15/20 1145 94 %     Weight 08/15/20 1144 (!) 305 lb (138.3 kg)     Height 08/15/20 1144 5\' 11"  (1.803 m)     Head Circumference --      Peak Flow --      Pain Score 08/15/20 1150 7     Pain Loc --      Pain Edu? --      Excl. in [place]? --     Constitutional: Alert and oriented. Well appearing and in no acute distress. Eyes: Conjunctivae are normal.  PERRL Head: Atraumatic.  Nontender at temples Nose: No congestion/rhinnorhea. Mouth/Throat: Mucous membranes are moist.   Neck:  supple no lymphadenopathy noted Cardiovascular: Normal rate, regular rhythm. Heart sounds are normal Respiratory: Normal respiratory effort.  No retractions, lungs c t a  GU: deferred Musculoskeletal: FROM all extremities, warm and well perfused Neurologic:  Normal speech and language.  Skin:  Skin is warm, dry and intact. No rash noted. Psychiatric: Mood and affect are normal. Speech and behavior are normal.  ____________________________________________   LABS (all labs ordered are listed, but only abnormal results are displayed)  Labs Reviewed  BASIC METABOLIC PANEL - Abnormal; Notable for the following components:      Result Value   Glucose, Bld 189 (*)    All other components within normal limits  CBG MONITORING, ED - Abnormal; Notable for the following components:   Glucose-Capillary 169 (*)    All other components within normal limits  CBC  HEPATIC FUNCTION PANEL  SEDIMENTATION RATE  URINALYSIS, COMPLETE (UACMP) WITH MICROSCOPIC  C-REACTIVE PROTEIN   ____________________________________________   ____________________________________________  RADIOLOGY  CT of the head  ____________________________________________   PROCEDURES  Procedure(s) performed: Ekg, see physician read  Procedures    ____________________________________________   INITIAL IMPRESSION / ASSESSMENT AND PLAN / ED  COURSE  Pertinent labs & imaging results that were available during my care of the patient were reviewed by me and considered in my medical decision making (see chart for details).   Patient is a 70 year old male presents with headache, dizziness, double vision since Friday.  See HPI.  Physical exam shows patient to appear stable  DDx: Temporal arteritis, optic migraine, stroke  CBC is normal, basic metabolic panel has a glucose of 189   CT of the head is negative for any acute abnormality.  Reviewed by me  Patient was given pain medication fluids, patient continues to have double vision although the headache has dissipated.  Due to the continued double vision did speak with Dr. Tamala Julian.  He did evaluate the patient.  He advises that we do a MRV of the head.  mrv is negative  Explained the findings to the patient. F/u with regular eye doctor, or [facility], return to the ER if worsening, he was discharged in stable condition in the  care of his family member  Jerry Huynh was evaluated in Emergency Department on 08/15/2020 for the symptoms described in the history of present illness. He was evaluated in the context of the global COVID-19 pandemic, which necessitated consideration that the patient might be at risk for infection with the SARS-CoV-2 virus that causes COVID-19. Institutional protocols and algorithms that pertain to the evaluation of patients at risk for COVID-19 are in a state of rapid change based on information released by regulatory bodies including the CDC and federal and state organizations. These policies and algorithms were followed during the patient's care in the ED.    As part of my medical decision making, I reviewed the following data within the [place] History obtained from family, Nursing notes reviewed and incorporated, Labs reviewed , EKG interpreted NSR, Old chart reviewed, Radiograph reviewed , Evaluated by EM attending , Notes from prior  ED visits and  Controlled Substance Database  ____________________________________________   FINAL CLINICAL IMPRESSION(S) / ED DIAGNOSES  Final diagnoses:  Double vision with both eyes open  Bad headache      NEW MEDICATIONS STARTED DURING THIS VISIT:  New Prescriptions   No medications on file     Note:  This document was prepared using Dragon voice recognition software and may include unintentional dictation errors.    Versie Starks, PA-C 08/15/20 1851    Vladimir Crofts, MD 08/16/20 1120

## 2020-08-15 NOTE — ED Triage Notes (Signed)
Pt via POV from home. Pt c/o double vision, R-sided headache and pain behind the R eye, SOB, and dizziness that started Friday. Denies hx of stroke. Pt is A&Ox4 and NAD. Denies cough. Denies NVD.

## 2020-08-15 NOTE — ED Notes (Signed)
Pt returned from MRI °

## 2020-08-15 NOTE — ED Notes (Signed)
Patient transported to MRI 

## 2020-09-20 ENCOUNTER — Other Ambulatory Visit
Admission: RE | Admit: 2020-09-20 | Discharge: 2020-09-20 | Disposition: A | Discharge status: Home / Self Care | Payer: Medicare Other | Source: Ambulatory Visit | Attending: Ophthalmology | Admitting: Ophthalmology

## 2020-09-20 DIAGNOSIS — H532 Diplopia: Secondary | ICD-10-CM | POA: Diagnosis present

## 2020-09-20 LAB — CBC WITH DIFFERENTIAL/PLATELET
Abs Immature Granulocytes: 0.04 10*3/uL (ref 0.00–0.07)
Basophils Absolute: 0.1 10*3/uL (ref 0.0–0.1)
Basophils Relative: 1 %
Eosinophils Absolute: 0.2 10*3/uL (ref 0.0–0.5)
Eosinophils Relative: 2 %
HCT: 41.2 % (ref 39.0–52.0)
Hemoglobin: 14.4 g/dL (ref 13.0–17.0)
Immature Granulocytes: 0 %
Lymphocytes Relative: 31 %
Lymphs Abs: 3.2 10*3/uL (ref 0.7–4.0)
MCH: 31.9 pg (ref 26.0–34.0)
MCHC: 35 g/dL (ref 30.0–36.0)
MCV: 91.4 fL (ref 80.0–100.0)
Monocytes Absolute: 0.8 10*3/uL (ref 0.1–1.0)
Monocytes Relative: 8 %
Neutro Abs: 6.2 10*3/uL (ref 1.7–7.7)
Neutrophils Relative %: 58 %
Platelets: 290 10*3/uL (ref 150–400)
RBC: 4.51 MIL/uL (ref 4.22–5.81)
RDW: 12.2 % (ref 11.5–15.5)
WBC: 10.6 10*3/uL — ABNORMAL HIGH (ref 4.0–10.5)
nRBC: 0 % (ref 0.0–0.2)

## 2020-09-20 LAB — C-REACTIVE PROTEIN: CRP: 1.6 mg/dL — ABNORMAL HIGH (ref <1.0)

## 2020-09-20 LAB — SEDIMENTATION RATE: Sed Rate: 16 mm/hr (ref 0–20)

## 2021-02-02 ENCOUNTER — Other Ambulatory Visit: Payer: Self-pay

## 2021-02-02 ENCOUNTER — Ambulatory Visit (INDEPENDENT_AMBULATORY_CARE_PROVIDER_SITE_OTHER): Payer: Medicare Other | Admitting: Dermatology

## 2021-02-02 DIAGNOSIS — D1800 Hemangioma unspecified site: Secondary | ICD-10-CM

## 2021-02-02 DIAGNOSIS — L821 Other seborrheic keratosis: Secondary | ICD-10-CM

## 2021-02-02 DIAGNOSIS — L578 Other skin changes due to chronic exposure to nonionizing radiation: Secondary | ICD-10-CM

## 2021-02-02 DIAGNOSIS — L719 Rosacea, unspecified: Secondary | ICD-10-CM

## 2021-02-02 DIAGNOSIS — Z1283 Encounter for screening for malignant neoplasm of skin: Secondary | ICD-10-CM | POA: Diagnosis not present

## 2021-02-02 DIAGNOSIS — L820 Inflamed seborrheic keratosis: Secondary | ICD-10-CM | POA: Diagnosis not present

## 2021-02-02 DIAGNOSIS — L570 Actinic keratosis: Secondary | ICD-10-CM

## 2021-02-02 DIAGNOSIS — D229 Melanocytic nevi, unspecified: Secondary | ICD-10-CM

## 2021-02-02 DIAGNOSIS — Z85828 Personal history of other malignant neoplasm of skin: Secondary | ICD-10-CM | POA: Diagnosis not present

## 2021-02-02 DIAGNOSIS — Z86007 Personal history of in-situ neoplasm of skin: Secondary | ICD-10-CM | POA: Diagnosis not present

## 2021-02-02 DIAGNOSIS — L814 Other melanin hyperpigmentation: Secondary | ICD-10-CM

## 2021-02-02 NOTE — Patient Instructions (Signed)

## 2021-02-02 NOTE — Progress Notes (Signed)
Follow-Up Visit   Subjective  Jerry Huynh is a 70 y.o. male who presents for the following: Rash (Chest, 34m itchy, no treatment) and Upper body skin exam (Hx of BCC L upper back post shoulder inferior, Hx of SCC IS L upper back post shoulder sup, Hx of AKs). The patient presents for Upper Body Skin Exam (UBSE) for skin cancer screening and mole check.   The following portions of the chart were reviewed this encounter and updated as appropriate:   Tobacco  Allergies  Meds  Problems  Med Hx  Surg Hx  Fam Hx     Review of Systems:  No other skin or systemic complaints except as noted in HPI or Assessment and Plan.  Objective  Well appearing patient in no apparent distress; mood and affect are within normal limits.  All skin waist up examined.  Left Upper Back post shoulder inferior Well healed scar with no evidence of recurrence.   Left Upper Back post shoulder superior Well healed scar with no evidence of recurrence, no lymphadenopathy.   bil arms/hands, chest, L shouler x 19, Total =19 (19) Erythematous keratotic or waxy stuck-on papule or plaque.   face x 2 (2) Pink scaly macules   face Erythema with dilated vessels cheeks, nose   Assessment & Plan   Lentigines - Scattered tan macules - Due to sun exposure - Benign-appearing, observe - Recommend daily broad spectrum sunscreen SPF 30+ to sun-exposed areas, reapply every 2 hours as needed. - Call for any changes  Seborrheic Keratoses - Stuck-on, waxy, tan-brown papules and/or plaques  - Benign-appearing - Discussed benign etiology and prognosis. - Observe - Call for any changes  Melanocytic Nevi - Tan-brown and/or pink-flesh-colored symmetric macules and papules - Benign appearing on exam today - Observation - Call clinic for new or changing moles - Recommend daily use of broad spectrum spf 30+ sunscreen to sun-exposed areas.   Hemangiomas - Red papules - Discussed benign nature - Observe - Call  for any changes  Actinic Damage - Chronic condition, secondary to cumulative UV/sun exposure - diffuse scaly erythematous macules with underlying dyspigmentation - Recommend daily broad spectrum sunscreen SPF 30+ to sun-exposed areas, reapply every 2 hours as needed.  - Staying in the shade or wearing long sleeves, sun glasses (UVA+UVB protection) and wide brim hats (4-inch brim around the entire circumference of the hat) are also recommended for sun protection.  - Call for new or changing lesions.  Skin cancer screening performed today.  History of basal cell carcinoma (BCC) Left Upper Back post shoulder inferior Clear. Observe for recurrence. Call clinic for new or changing lesions.  Recommend regular skin exams, daily broad-spectrum spf 30+ sunscreen use, and photoprotection.    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 Observe for recurrence. Call clinic for new or changing lesions.  Recommend regular skin exams, daily broad-spectrum spf 30+ sunscreen use, and photoprotection.    Inflamed seborrheic keratosis bil arms/hands, chest, L shouler x 19, Total =19 Destruction of lesion - bil arms/hands, chest, L shouler x 19, Total =19 Complexity: simple   Destruction method: cryotherapy   Informed consent: discussed and consent obtained   Timeout:  patient name, date of birth, surgical site, and procedure verified Lesion destroyed using liquid nitrogen: Yes   Region frozen until ice ball extended beyond lesion: Yes   Outcome: patient tolerated procedure well with no complications   Post-procedure details: wound care instructions  given    Destruction of lesion - bil arms/hands, chest, L shouler x 19, Total =19  AK (actinic keratosis) (2) face x 2 Destruction of lesion - face x 2 Complexity: simple   Destruction method: cryotherapy   Informed consent: discussed and consent obtained   Timeout:  patient name, date of  birth, surgical site, and procedure verified Lesion destroyed using liquid nitrogen: Yes   Region frozen until ice ball extended beyond lesion: Yes   Outcome: patient tolerated procedure well with no complications   Post-procedure details: wound care instructions given    Rosacea face Rosacea is a chronic progressive skin condition usually affecting the face of adults, causing redness and/or acne bumps. It is treatable but not curable. It sometimes affects the eyes (ocular rosacea) as well. It may respond to topical and/or systemic medication and can flare with stress, sun exposure, alcohol, exercise and some foods.  Daily application of broad spectrum spf 30+ sunscreen to face is recommended to reduce flares.  Discussed IPL, $350 per txt session, several txts for best results  Skin cancer screening  Return in about 1 year (around 02/02/2022) for UBSE, Hx of BCC, Hx of SCC IS, Hx of AKs.  I, Othelia Pulling, RMA, am acting as scribe for Sarina Ser, MD . Documentation: I have reviewed the above documentation for accuracy and completeness, and I agree with the above.  Sarina Ser, MD

## 2021-02-04 ENCOUNTER — Encounter: Payer: Self-pay | Admitting: Dermatology

## 2021-05-25 ENCOUNTER — Other Ambulatory Visit: Payer: Self-pay | Admitting: Family Medicine

## 2021-05-25 DIAGNOSIS — R1011 Right upper quadrant pain: Secondary | ICD-10-CM

## 2021-06-02 ENCOUNTER — Other Ambulatory Visit: Payer: Self-pay

## 2021-06-02 ENCOUNTER — Ambulatory Visit
Admission: RE | Admit: 2021-06-02 | Discharge: 2021-06-02 | Disposition: A | Discharge status: Home / Self Care | Payer: Medicare Other | Source: Ambulatory Visit | Attending: Family Medicine | Admitting: Family Medicine

## 2021-06-02 DIAGNOSIS — R1011 Right upper quadrant pain: Secondary | ICD-10-CM | POA: Diagnosis not present

## 2021-06-08 ENCOUNTER — Other Ambulatory Visit: Payer: Self-pay | Admitting: Gastroenterology

## 2021-06-08 DIAGNOSIS — R1011 Right upper quadrant pain: Secondary | ICD-10-CM

## 2021-06-20 ENCOUNTER — Ambulatory Visit: Payer: Medicare Other

## 2021-06-21 ENCOUNTER — Ambulatory Visit
Admission: RE | Admit: 2021-06-21 | Discharge: 2021-06-21 | Disposition: A | Discharge status: Home / Self Care | Payer: Medicare Other | Source: Ambulatory Visit | Attending: Gastroenterology | Admitting: Gastroenterology

## 2021-06-21 ENCOUNTER — Other Ambulatory Visit: Payer: Self-pay

## 2021-06-21 DIAGNOSIS — R1011 Right upper quadrant pain: Secondary | ICD-10-CM | POA: Insufficient documentation

## 2021-07-16 ENCOUNTER — Other Ambulatory Visit: Payer: Self-pay

## 2021-07-16 ENCOUNTER — Emergency Department
Admission: EM | Admit: 2021-07-16 | Discharge: 2021-07-16 | Disposition: A | Discharge status: Home / Self Care | Payer: Medicare Other | Attending: Emergency Medicine | Admitting: Emergency Medicine

## 2021-07-16 ENCOUNTER — Emergency Department: Payer: Medicare Other

## 2021-07-16 DIAGNOSIS — M5410 Radiculopathy, site unspecified: Secondary | ICD-10-CM | POA: Diagnosis not present

## 2021-07-16 DIAGNOSIS — G8929 Other chronic pain: Secondary | ICD-10-CM | POA: Diagnosis not present

## 2021-07-16 DIAGNOSIS — Z79899 Other long term (current) drug therapy: Secondary | ICD-10-CM | POA: Insufficient documentation

## 2021-07-16 DIAGNOSIS — R1011 Right upper quadrant pain: Secondary | ICD-10-CM | POA: Insufficient documentation

## 2021-07-16 DIAGNOSIS — I10 Essential (primary) hypertension: Secondary | ICD-10-CM | POA: Diagnosis not present

## 2021-07-16 DIAGNOSIS — E119 Type 2 diabetes mellitus without complications: Secondary | ICD-10-CM | POA: Insufficient documentation

## 2021-07-16 DIAGNOSIS — R0789 Other chest pain: Secondary | ICD-10-CM | POA: Insufficient documentation

## 2021-07-16 LAB — COMPREHENSIVE METABOLIC PANEL
ALT: 30 U/L (ref 0–44)
AST: 25 U/L (ref 15–41)
Albumin: 4.4 g/dL (ref 3.5–5.0)
Alkaline Phosphatase: 76 U/L (ref 38–126)
Anion gap: 11 (ref 5–15)
BUN: 24 mg/dL — ABNORMAL HIGH (ref 8–23)
CO2: 26 mmol/L (ref 22–32)
Calcium: 9.4 mg/dL (ref 8.9–10.3)
Chloride: 96 mmol/L — ABNORMAL LOW (ref 98–111)
Creatinine, Ser: 1.07 mg/dL (ref 0.61–1.24)
GFR, Estimated: >60 mL/min (ref >60)
Glucose, Bld: 258 mg/dL — ABNORMAL HIGH (ref 70–99)
Potassium: 4.5 mmol/L (ref 3.5–5.1)
Sodium: 133 mmol/L — ABNORMAL LOW (ref 135–145)
Total Bilirubin: 0.5 mg/dL (ref 0.3–1.2)
Total Protein: 7.9 g/dL (ref 6.5–8.1)

## 2021-07-16 LAB — CBC
HCT: 44.5 % (ref 39.0–52.0)
Hemoglobin: 15.5 g/dL (ref 13.0–17.0)
MCH: 31.7 pg (ref 26.0–34.0)
MCHC: 34.8 g/dL (ref 30.0–36.0)
MCV: 91 fL (ref 80.0–100.0)
Platelets: 243 10*3/uL (ref 150–400)
RBC: 4.89 MIL/uL (ref 4.22–5.81)
RDW: 11.9 % (ref 11.5–15.5)
WBC: 8.4 10*3/uL (ref 4.0–10.5)
nRBC: 0 % (ref 0.0–0.2)

## 2021-07-16 LAB — LIPASE, BLOOD: Lipase: 31 U/L (ref 11–51)

## 2021-07-16 MED ORDER — OXYCODONE-ACETAMINOPHEN 5-325 MG PO TABS: 1.0000 | ORAL_TABLET | ORAL | 0 refills | Status: AC | PRN

## 2021-07-16 MED ORDER — HYDROMORPHONE HCL 1 MG/ML IJ SOLN
1.0000 mg | Freq: Once | INTRAMUSCULAR | Status: AC
  Administered 2021-07-16: 1 mg via INTRAVENOUS
  Filled 2021-07-16: qty 1

## 2021-07-16 MED ORDER — IOHEXOL 350 MG/ML SOLN
100.0000 mL | Freq: Once | INTRAVENOUS | Status: AC | PRN
  Administered 2021-07-16: 100 mL via INTRAVENOUS
  Filled 2021-07-16: qty 100

## 2021-07-16 MED ORDER — OXYCODONE-ACETAMINOPHEN 5-325 MG PO TABS
1.0000 | ORAL_TABLET | Freq: Once | ORAL | Status: AC
  Administered 2021-07-16: 1 via ORAL
  Filled 2021-07-16: qty 1

## 2021-07-16 MED ORDER — LIDOCAINE 5 % EX PTCH: 1.0000 | MEDICATED_PATCH | Freq: Two times a day (BID) | CUTANEOUS | 0 refills | Status: AC

## 2021-07-16 MED ORDER — ONDANSETRON HCL 4 MG/2ML IJ SOLN
4.0000 mg | Freq: Once | INTRAMUSCULAR | Status: AC
  Administered 2021-07-16: 4 mg via INTRAVENOUS
  Filled 2021-07-16: qty 2

## 2021-07-16 MED ORDER — ONDANSETRON HCL 4 MG PO TABS: 4.0000 mg | ORAL_TABLET | Freq: Three times a day (TID) | ORAL | 0 refills | Status: AC | PRN

## 2021-07-16 MED ORDER — KETOROLAC TROMETHAMINE 30 MG/ML IJ SOLN
15.0000 mg | Freq: Once | INTRAMUSCULAR | Status: AC
  Administered 2021-07-16: 15 mg via INTRAVENOUS
  Filled 2021-07-16: qty 1

## 2021-07-16 MED ORDER — ONDANSETRON 4 MG PO TBDP
4.0000 mg | ORAL_TABLET | Freq: Once | ORAL | Status: AC
  Administered 2021-07-16: 4 mg via ORAL
  Filled 2021-07-16: qty 1

## 2021-07-16 NOTE — ED Provider Notes (Signed)
[facility] Provider Note    Event Date/Time   First MD Initiated Contact with Patient 07/16/21 [phone]     (approximate)   History   Abdominal Pain   HPI  Jerry Huynh is a 71 y.o. male  with PMHx HTN, HLD, DM below here with abdominal pain. Pt reports that intermittently for at least the last several weeks to months, he has had intermittent, severe, right-sided lower chest wall/right upper quadrant abdominal pain.  The pain radiates around his upper back to his lower right chest wall, is sharp and stabbing, and does appear to be somewhat positional although not necessarily always worsened with positions.  He has no relation with eating.  No nausea or vomiting.  He has been seen by GI for this who did not think it was a GI related issue.      Physical Exam   Triage Vital Signs: ED Triage Vitals  Enc Vitals Group     BP 07/16/21 0810 (!) 209/107     Pulse Rate 07/16/21 0810 80     Resp 07/16/21 0810 18     Temp 07/16/21 0811 98.3 F (36.8 C)     Temp Source 07/16/21 0810 Oral     SpO2 07/16/21 0810 95 %     Weight 07/16/21 0811 300 lb (136.1 kg)     Height 07/16/21 0811 5\' 11"  (1.803 m)     Head Circumference --      Peak Flow --      Pain Score 07/16/21 0811 10     Pain Loc --      Pain Edu? --      Excl. in [place]? --     Most recent vital signs: Vitals:   07/16/21 0811 07/16/21 1144  BP:  106/71  Pulse:  72  Resp:  16  Temp: 98.3 F (36.8 C) 98.4 F (36.9 C)  SpO2:  91%     General: Awake, no distress.  CV:  Good peripheral perfusion.  Regular rate and rhythm. Resp:  Normal effort.  Lungs clear to auscultation bilaterally. Abd:  No distention.  No overt right upper quadrant tenderness.  Negative Murphy's.  No rebound or guarding. Other:  Moderate tenderness to palpation along the right lower chest wall, along the lower intercostal spaces, and a radicular type pattern.  No overlying skin changes or lesions.  No bruising.   ED Results  / Procedures / Treatments   Labs (all labs ordered are listed, but only abnormal results are displayed) Labs Reviewed  COMPREHENSIVE METABOLIC PANEL - Abnormal; Notable for the following components:      Result Value   Sodium 133 (*)    Chloride 96 (*)    Glucose, Bld 258 (*)    BUN 24 (*)    All other components within normal limits  LIPASE, BLOOD  CBC  URINALYSIS, ROUTINE W REFLEX MICROSCOPIC     EKG Normal sinus rhythm, ventricular rate 83.  PR 168, QRS 84, QTc 462.  No acute ST elevations or depressions.  EKG evidence of acute ischemia or infarct.   RADIOLOGY Chest x-ray: Clear, no focal abnormality CT angio: No evidence of dissection, no acute pathology.  Granuloma versus nodule noted in the lungs, recommend repeat in 6 months.  This was discussed with patient.   I also independently reviewed and agree wit radiologist interpretations.   PROCEDURES:  Critical Care performed: No   MEDICATIONS ORDERED IN ED: Medications  HYDROmorphone (DILAUDID) injection 1  mg (1 mg Intravenous Given 07/16/21 1015)  ondansetron (ZOFRAN) injection 4 mg (4 mg Intravenous Given 07/16/21 1016)  iohexol (OMNIPAQUE) 350 MG/ML injection 100 mL (100 mLs Intravenous Contrast Given 07/16/21 1032)  ketorolac (TORADOL) 30 MG/ML injection 15 mg (15 mg Intravenous Given 07/16/21 1115)  oxyCODONE-acetaminophen (PERCOCET/ROXICET) 5-325 MG per tablet 1 tablet (1 tablet Oral Given 07/16/21 1114)  ondansetron (ZOFRAN-ODT) disintegrating tablet 4 mg (4 mg Oral Given 07/16/21 1115)     IMPRESSION / MDM / ASSESSMENT AND PLAN / ED COURSE  I reviewed the triage vital signs and the nursing notes.                               The patient is on the cardiac monitor to evaluate for evidence of arrhythmia and/or significant heart rate changes.   Ddx:  Thoracic radicular pain, focal neuropathy, intercostal pain/spasm, atypical chest pain.  Unlikely PE or dissection.  Unlikely ACS.  Unlikely cholecystitis,  hepatitis, or intra-abdominal pathology based on exam and absence of GI symptoms.   MDM:  71 year old male with past medical history of hypertension, diabetes, here with acute on chronic right-sided chest pain.  Patient has been seen multiple times by his PCP with this per my review of records.  Clinically, the history sounds more so like acute thoracic radiculopathy, versus other musculoskeletal chest pain/intercostal pain.  It is somewhat positional.  Patient did have recent GI evaluation and is currently being worked up for possible EGD.  However, his exam and history is less consistent with this.  Given that he has not had CT imaging, and significant hypertension was noted initially, full CT scan was obtained and shows no evidence of dissection or other acute abnormality.  He has some incidental findings for which she will follow-up with his PCP.  Otherwise, lab work is very reassuring.  EKG is nonischemic, do not suspect ACS.  CBC without significant leukocytosis or anemia.  CMP with normal LFTs and renal function.  Lipase normal.  Patient feels markedly improved with analgesia here and is tolerating p.o.  Chest x-ray clear.  Given chronicity of his symptoms with reassuring lab work and imaging, and improvement with analgesia, I suspect patient likely has musculoskeletal/thoracic radicular pain.  Will trial topical medications as well as brief course of analgesia and give good return precautions.   MEDICATIONS GIVEN IN ED: Medications  HYDROmorphone (DILAUDID) injection 1 mg (1 mg Intravenous Given 07/16/21 1015)  ondansetron (ZOFRAN) injection 4 mg (4 mg Intravenous Given 07/16/21 1016)  iohexol (OMNIPAQUE) 350 MG/ML injection 100 mL (100 mLs Intravenous Contrast Given 07/16/21 1032)  ketorolac (TORADOL) 30 MG/ML injection 15 mg (15 mg Intravenous Given 07/16/21 1115)  oxyCODONE-acetaminophen (PERCOCET/ROXICET) 5-325 MG per tablet 1 tablet (1 tablet Oral Given 07/16/21 1114)  ondansetron  (ZOFRAN-ODT) disintegrating tablet 4 mg (4 mg Oral Given 07/16/21 1115)     Consults:  None   EMR reviewed  GI notes from [facility] including recent upper GI on 1/31, notes from office visit with [facility] on 1/17 PCP notes with Dr. Kary Kos     FINAL CLINICAL IMPRESSION(S) / ED DIAGNOSES   Final diagnoses:  Atypical chest pain  Radicular pain     Rx / DC Orders   ED Discharge Orders          Ordered    oxyCODONE-acetaminophen (PERCOCET) 5-325 MG tablet  Every 4 hours PRN        07/16/21 1148  lidocaine (LIDODERM) 5 %  Every 12 hours        07/16/21 1148    ondansetron (ZOFRAN) 4 MG tablet  Every 8 hours PRN        07/16/21 1149             Note:  This document was prepared using Dragon voice recognition software and may include unintentional dictation errors.   Duffy Bruce, MD 07/16/21 [phone]

## 2021-07-16 NOTE — ED Notes (Signed)
Patient taken to imaging. 

## 2021-07-16 NOTE — Discharge Instructions (Addendum)
Try the Lidocaine patches for pain  Take the stronger pain medication as needed for severe pain  Your CT scan showed a non specific nodule in your lung, you should discuss with your doc as the Radiologist recommends repeat imaging in 6 months time

## 2021-07-16 NOTE — ED Notes (Signed)
Patient declined discharge vital signs. 

## 2021-07-16 NOTE — ED Triage Notes (Signed)
Pt c/o RUQ that radiates into the back for several months intermittently, since last night after coughing the pain has been severe and hurts to take deep breathe or move.

## 2021-09-19 ENCOUNTER — Other Ambulatory Visit: Payer: Self-pay | Admitting: Neurology

## 2021-09-19 DIAGNOSIS — G580 Intercostal neuropathy: Secondary | ICD-10-CM

## 2021-09-19 DIAGNOSIS — R1011 Right upper quadrant pain: Secondary | ICD-10-CM

## 2021-09-28 ENCOUNTER — Ambulatory Visit
Admission: RE | Admit: 2021-09-28 | Discharge: 2021-09-28 | Disposition: A | Discharge status: Home / Self Care | Payer: Medicare Other | Source: Ambulatory Visit | Attending: Neurology | Admitting: Neurology

## 2021-09-28 DIAGNOSIS — G580 Intercostal neuropathy: Secondary | ICD-10-CM | POA: Diagnosis present

## 2021-12-20 ENCOUNTER — Other Ambulatory Visit: Payer: Self-pay | Admitting: Student

## 2021-12-20 DIAGNOSIS — R200 Anesthesia of skin: Secondary | ICD-10-CM

## 2021-12-21 ENCOUNTER — Other Ambulatory Visit: Payer: Self-pay | Admitting: Student

## 2021-12-21 ENCOUNTER — Other Ambulatory Visit (HOSPITAL_COMMUNITY): Payer: Self-pay | Admitting: Student

## 2021-12-21 DIAGNOSIS — R200 Anesthesia of skin: Secondary | ICD-10-CM

## 2021-12-30 ENCOUNTER — Ambulatory Visit
Admission: RE | Admit: 2021-12-30 | Discharge: 2021-12-30 | Disposition: A | Discharge status: Home / Self Care | Payer: Medicare Other | Source: Ambulatory Visit | Attending: Student | Admitting: Student

## 2021-12-30 DIAGNOSIS — R200 Anesthesia of skin: Secondary | ICD-10-CM | POA: Insufficient documentation

## 2022-01-06 ENCOUNTER — Encounter: Payer: Self-pay | Admitting: *Deleted

## 2022-01-09 ENCOUNTER — Encounter: Payer: Self-pay | Admitting: *Deleted

## 2022-01-09 ENCOUNTER — Encounter
Admission: RE | Disposition: A | Discharge status: Home / Self Care | Payer: Self-pay | Source: Home / Self Care | Attending: Gastroenterology

## 2022-01-09 ENCOUNTER — Ambulatory Visit: Payer: Medicare Other | Admitting: Certified Registered"

## 2022-01-09 ENCOUNTER — Ambulatory Visit
Admission: RE | Admit: 2022-01-09 | Discharge: 2022-01-09 | Disposition: A | Discharge status: Home / Self Care | Payer: Medicare Other | Attending: Gastroenterology | Admitting: Gastroenterology

## 2022-01-09 DIAGNOSIS — Z800 Family history of malignant neoplasm of digestive organs: Secondary | ICD-10-CM | POA: Diagnosis not present

## 2022-01-09 DIAGNOSIS — Z7984 Long term (current) use of oral hypoglycemic drugs: Secondary | ICD-10-CM | POA: Insufficient documentation

## 2022-01-09 DIAGNOSIS — F17210 Nicotine dependence, cigarettes, uncomplicated: Secondary | ICD-10-CM | POA: Insufficient documentation

## 2022-01-09 DIAGNOSIS — R933 Abnormal findings on diagnostic imaging of other parts of digestive tract: Secondary | ICD-10-CM | POA: Insufficient documentation

## 2022-01-09 DIAGNOSIS — E119 Type 2 diabetes mellitus without complications: Secondary | ICD-10-CM | POA: Insufficient documentation

## 2022-01-09 DIAGNOSIS — F32A Depression, unspecified: Secondary | ICD-10-CM | POA: Insufficient documentation

## 2022-01-09 DIAGNOSIS — I10 Essential (primary) hypertension: Secondary | ICD-10-CM | POA: Insufficient documentation

## 2022-01-09 DIAGNOSIS — R1310 Dysphagia, unspecified: Secondary | ICD-10-CM | POA: Diagnosis not present

## 2022-01-09 HISTORY — PX: ESOPHAGOGASTRODUODENOSCOPY (EGD) WITH PROPOFOL: SHX5813

## 2022-01-09 LAB — GLUCOSE, CAPILLARY: Glucose-Capillary: 167 mg/dL — ABNORMAL HIGH (ref 70–99)

## 2022-01-09 SURGERY — ESOPHAGOGASTRODUODENOSCOPY (EGD) WITH PROPOFOL
Anesthesia: General

## 2022-01-09 MED ORDER — PROPOFOL 10 MG/ML IV BOLUS
INTRAVENOUS | Status: DC | PRN
  Administered 2022-01-09: 20 mg via INTRAVENOUS
  Administered 2022-01-09: 40 mg via INTRAVENOUS

## 2022-01-09 MED ORDER — GLYCOPYRROLATE 0.2 MG/ML IJ SOLN
INTRAMUSCULAR | Status: DC | PRN
  Administered 2022-01-09 (×2): .2 mg via INTRAVENOUS

## 2022-01-09 MED ORDER — LIDOCAINE HCL (CARDIAC) PF 100 MG/5ML IV SOSY
PREFILLED_SYRINGE | INTRAVENOUS | Status: DC | PRN
  Administered 2022-01-09: 40 mg via INTRAVENOUS

## 2022-01-09 MED ORDER — SODIUM CHLORIDE 0.9 % IV SOLN: INTRAVENOUS | Status: DC

## 2022-01-09 MED ORDER — LIDOCAINE HCL (PF) 1 % IJ SOLN
INTRAMUSCULAR | Status: AC
  Filled 2022-01-09: qty 2

## 2022-01-09 MED ORDER — PROPOFOL 500 MG/50ML IV EMUL
INTRAVENOUS | Status: DC | PRN
  Administered 2022-01-09: 145 ug/kg/min via INTRAVENOUS

## 2022-01-09 NOTE — Op Note (Signed)
[facility] Gastroenterology Patient Name: Jerry Huynh Procedure Date: 01/09/2022 10:27 AM MRN: [id number] Account #: [id number] Date of Birth: 1950-12-06 Admit Type: Outpatient Age: 71 Room: [facility] ENDO ROOM 3 Gender: Male Note Status: Finalized Instrument Name: Upper Endoscope [id number] Procedure:             Upper GI endoscopy Indications:           Abnormal UGI series Providers:             Andrey Farmer MD, MD Medicines:             Monitored Anesthesia Care Complications:         No immediate complications. Estimated blood loss:                         Minimal. Procedure:             Pre-Anesthesia Assessment:                        - Prior to the procedure, a History and Physical was                         performed, and patient medications and allergies were                         reviewed. The patient is competent. The risks and                         benefits of the procedure and the sedation options and                         risks were discussed with the patient. All questions                         were answered and informed consent was obtained.                         Patient identification and proposed procedure were                         verified by the physician, the nurse, the                         anesthesiologist, the anesthetist and the technician                         in the endoscopy suite. Mental Status Examination:                         alert and oriented. Airway Examination: normal                         oropharyngeal airway and neck mobility. Respiratory                         Examination: clear to auscultation. CV Examination:                         normal. Prophylactic Antibiotics: The patient does not  require prophylactic antibiotics. Prior                         Anticoagulants: The patient has taken no previous                         anticoagulant or antiplatelet agents. ASA Grade                          Assessment: III - A patient with severe systemic                         disease. After reviewing the risks and benefits, the                         patient was deemed in satisfactory condition to                         undergo the procedure. The anesthesia plan was to use                         monitored anesthesia care (MAC). Immediately prior to                         administration of medications, the patient was                         re-assessed for adequacy to receive sedatives. The                         heart rate, respiratory rate, oxygen saturations,                         blood pressure, adequacy of pulmonary ventilation, and                         response to care were monitored throughout the                         procedure. The physical status of the patient was                         re-assessed after the procedure.                        After obtaining informed consent, the endoscope was                         passed under direct vision. Throughout the procedure,                         the patient's blood pressure, pulse, and oxygen                         saturations were monitored continuously. The Endoscope                         was introduced through the mouth, and advanced to the  second part of duodenum. The upper GI endoscopy was                         accomplished without difficulty. The patient tolerated                         the procedure well. Findings:      No endoscopic abnormality was evident in the esophagus to explain the       patient's complaint of dysphagia. There was a slight angulation at the       distal esophagus but no stricture. Biopsies were obtained from the       proximal and distal esophagus with cold forceps for histology of       suspected eosinophilic esophagitis. Estimated blood loss was minimal.      The entire examined stomach was normal.      The examined duodenum was  normal. Impression:            - No endoscopic esophageal abnormality to explain                         patient's dysphagia. Biopsied.                        - Normal stomach.                        - Normal examined duodenum. Recommendation:        - Discharge patient to home.                        - Resume previous diet.                        - Continue present medications.                        - Await pathology results.                        - Return to referring physician as previously                         scheduled. Procedure Code(s):     --- Professional ---                        [phone], Esophagogastroduodenoscopy, flexible,                         transoral; with biopsy, single or multiple Diagnosis Code(s):     --- Professional ---                        R13.10, Dysphagia, unspecified                        R93.3, Abnormal findings on diagnostic imaging of                         other parts of digestive tract CPT copyright 2019 [employer]. All rights reserved. The codes documented in this report are preliminary and upon coder review may  be revised  to meet current compliance requirements. Andrey Farmer MD, MD 01/09/2022 11:05:43 AM Number of Addenda: 0 Note Initiated On: 01/09/2022 10:27 AM Estimated Blood Loss:  Estimated blood loss was minimal.      [facility]

## 2022-01-09 NOTE — H&P (Signed)
Outpatient short stay form Pre-procedure 01/09/2022  Lesly Rubenstein, MD  Primary Physician: [name], MD  Reason for visit:  Abnormal imaging  History of present illness:    71 y/o gentleman with obesity, hypertension, and DM II here for EGD due to abnormal barium swallow showing possible stricture. His major complaint is trouble swallowing with large pills. No blood thinners. Father with colon cancer. History of appendectomy and hernia repairs.    Current Facility-Administered Medications:    0.9 %  sodium chloride infusion, , Intravenous, Continuous, Jamese Trauger, Hilton Cork, MD, Last Rate: 20 mL/hr at 01/09/22 1047, Continued from Pre-op at 01/09/22 1047   lidocaine (PF) (XYLOCAINE) 1 % injection, , , ,   Facility-Administered Medications Ordered in Other Encounters:    glycopyrrolate (ROBINUL) injection, , Intravenous, Anesthesia Intra-op, Fletcher-Harrison, Tawana, CRNA, 0.2 mg at 01/09/22 1027  Medications Prior to Admission  Medication Sig Dispense Refill Last Dose   cetirizine (ZYRTEC) 10 MG tablet Take 10 mg by mouth daily.   01/09/2022   cyclobenzaprine (FLEXERIL) 10 MG tablet cyclobenzaprine 10 mg tablet  TAKE 1 TABLET BY MOUTH THREE TIMES DAILY   01/08/2022   escitalopram (LEXAPRO) 10 MG tablet Take 10 mg by mouth daily.   01/09/2022   gabapentin (NEURONTIN) 100 MG capsule Take 100 mg by mouth 3 (three) times daily.   01/09/2022   glipiZIDE (GLUCOTROL XL) 5 MG 24 hr tablet Take by mouth.   01/08/2022   lisinopril-hydrochlorothiazide (PRINZIDE,ZESTORETIC) 20-25 MG tablet Take 1 tablet by mouth daily.   01/08/2022   metoprolol succinate (TOPROL-XL) 25 MG 24 hr tablet Take 25 mg by mouth daily.   01/08/2022   omeprazole (PRILOSEC) 20 MG capsule Take 20 mg by mouth daily.   01/08/2022   Multiple Vitamin (MULTIVITAMIN) tablet Take 1 tablet by mouth daily.      Omega-3 1000 MG CAPS Take 1 capsule by mouth daily.      ondansetron (ZOFRAN) 4 MG tablet Take 1 tablet (4 mg total) by  mouth every 8 (eight) hours as needed for nausea or vomiting. 12 tablet 0    oxyCODONE-acetaminophen (PERCOCET) 5-325 MG tablet Take 1 tablet by mouth every 4 (four) hours as needed for severe pain (no more than 6 tabs daily). (Patient not taking: Reported on 01/09/2022) 20 tablet 0 Not Taking   terbinafine (LAMISIL) 250 MG tablet Take 1 tablet (250 mg total) by mouth daily. (Patient not taking: Reported on 07/07/2020) 90 tablet 0      Allergies  Allergen Reactions   Sulfa Antibiotics Anaphylaxis   Codeine Nausea And Vomiting     Past Medical History:  Diagnosis Date   Basal cell carcinoma 03/26/2014   Left upper back post. shoulder inf. Superficial.   Depression    Diabetes mellitus without complication ([place])    Hyperlipidemia    Hypertension    Squamous cell carcinoma of skin 03/26/2014   Left upper back post. shoulder sup. SCCis arising in [place].    Review of systems:  Otherwise negative.    Physical Exam  Gen: Alert, oriented. Appears stated age.  HEENT: PERRLA. Lungs: No respiratory distress CV: RRR Abd: soft, benign, no masses Ext: No edema    Planned procedures: Proceed with EGD. The patient understands the nature of the planned procedure, indications, risks, alternatives and potential complications including but not limited to bleeding, infection, perforation, damage to internal organs and possible oversedation/side effects from anesthesia. The patient agrees and gives consent to proceed.  Please refer to procedure notes  for findings, recommendations and patient disposition/instructions.     Lesly Rubenstein, MD [facility] Gastroenterology

## 2022-01-09 NOTE — Anesthesia Preprocedure Evaluation (Signed)
Anesthesia Evaluation  Patient identified by MRN, date of birth, ID band Patient awake    Reviewed: Allergy & Precautions, NPO status , Patient's Chart, lab work & pertinent test results  History of Anesthesia Complications Negative for: history of anesthetic complications  Airway Mallampati: III  TM Distance: >3 FB Neck ROM: Full    Dental  (+) Poor Dentition   Pulmonary shortness of breath and with exertion, neg sleep apnea, neg COPD, Current Smoker and Patient abstained from smoking.,    Pulmonary exam normal breath sounds clear to auscultation       Cardiovascular Exercise Tolerance: Poor METShypertension, Pt. on medications (-) CAD and (-) Past MI (-) dysrhythmias  Rhythm:Regular Rate:Normal - Systolic murmurs TTE: INTERPRETATION  NORMAL LEFT VENTRICULAR SYSTOLIC FUNCTION  WITH MILD LVH  NORMAL RIGHT VENTRICULAR SYSTOLIC FUNCTION  TRIVIAL REGURGITATION NOTED (See above)  NO VALVULAR STENOSIS  SCLEROTIC AoV  TRIVIAL MR, TR  EF >55%  Morphology: MILDLY THICKENED  Closest EF: >55% (Estimated)  LVH: MILD LVH  Mitral: TRIVIAL MR  Tricuspid: TRIVIAL TR    Neuro/Psych PSYCHIATRIC DISORDERS Depression negative neurological ROS     GI/Hepatic neg GERD  ,(+)     (-) substance abuse  ,   Endo/Other  diabetes, Oral Hypoglycemic AgentsMorbid obesity  Renal/GU negative Renal ROS     Musculoskeletal   Abdominal (+) + obese,   Peds  Hematology   Anesthesia Other Findings Past Medical History: 03/26/2014: Basal cell carcinoma     Comment:  Left upper back post. shoulder inf. Superficial. No date: Depression No date: Diabetes mellitus without complication (HCC) No date: Hyperlipidemia No date: Hypertension 03/26/2014: Squamous cell carcinoma of skin     Comment:  Left upper back post. shoulder sup. SCCis arising in LK.  Reproductive/Obstetrics                             Anesthesia  Physical  Anesthesia Plan  ASA: 3  Anesthesia Plan: General   Post-op Pain Management: Minimal or no pain anticipated   Induction: Intravenous  PONV Risk Score and Plan: 1 and Ondansetron, Propofol infusion and TIVA  Airway Management Planned: Nasal Cannula  Additional Equipment: None  Intra-op Plan:   Post-operative Plan:   Informed Consent: I have reviewed the patients History and Physical, chart, labs and discussed the procedure including the risks, benefits and alternatives for the proposed anesthesia with the patient or authorized representative who has indicated his/her understanding and acceptance.     Dental advisory given  Plan Discussed with: CRNA and Surgeon  Anesthesia Plan Comments: (Discussed risks of anesthesia with patient, including possibility of difficulty with spontaneous ventilation under anesthesia necessitating airway intervention, PONV, and rare risks such as cardiac or respiratory or neurological events. Patient understands.  During last colonoscopy patient says he had to stay in recovery area for 4 hours due to hypotension. He says he did not take his blood pressure meds this morning.)        Anesthesia Quick Evaluation

## 2022-01-09 NOTE — Transfer of Care (Signed)
Immediate Anesthesia Transfer of Care Note  Patient: Jerry Huynh  Procedure(s) Performed: ESOPHAGOGASTRODUODENOSCOPY (EGD) WITH PROPOFOL  Patient Location: Endoscopy Unit  Anesthesia Type:General  Level of Consciousness: drowsy and patient cooperative  Airway & Oxygen Therapy: Patient Spontanous Breathing and Patient connected to face mask oxygen  Post-op Assessment: Report given to RN and Post -op Vital signs reviewed and stable  Post vital signs: Reviewed and stable  Last Vitals:  Vitals Value Taken Time  BP 81/62 01/09/22 1106  Temp 35.9 C 01/09/22 1106  Pulse 74 01/09/22 1106  Resp 21 01/09/22 1106  SpO2 98 % 01/09/22 1106    Last Pain:  Vitals:   01/09/22 1106  TempSrc: Temporal  PainSc: Asleep         Complications: No notable events documented.

## 2022-01-09 NOTE — Anesthesia Procedure Notes (Signed)
Procedure Name: General with mask airway Date/Time: 01/09/2022 11:08 AM  Performed by: Kelton Pillar, CRNAPre-anesthesia Checklist: Patient identified, Emergency Drugs available, Suction available and Patient being monitored Patient Re-evaluated:Patient Re-evaluated prior to induction Oxygen Delivery Method: Simple face mask Induction Type: IV induction Placement Confirmation: positive ETCO2, CO2 detector and breath sounds checked- equal and bilateral Dental Injury: Teeth and Oropharynx as per pre-operative assessment

## 2022-01-09 NOTE — Anesthesia Postprocedure Evaluation (Signed)
Anesthesia Post Note  Patient: Jerry Huynh  Procedure(s) Performed: ESOPHAGOGASTRODUODENOSCOPY (EGD) WITH PROPOFOL  Patient location during evaluation: Endoscopy Anesthesia Type: General Level of consciousness: awake and alert Pain management: pain level controlled Vital Signs Assessment: post-procedure vital signs reviewed and stable Respiratory status: spontaneous breathing, nonlabored ventilation, respiratory function stable and patient connected to nasal cannula oxygen Cardiovascular status: blood pressure returned to baseline and stable Postop Assessment: no apparent nausea or vomiting Anesthetic complications: no   No notable events documented.   Last Vitals:  Vitals:   01/09/22 1116 01/09/22 1126  BP: 94/74   Pulse: 79   Resp: 15   Temp:    SpO2: 92% 93%    Last Pain:  Vitals:   01/09/22 1126  TempSrc:   PainSc: 0-No pain                 Arita Miss

## 2022-01-09 NOTE — Interval H&P Note (Signed)
History and Physical Interval Note:  01/09/2022 10:50 AM  Jerry Huynh  has presented today for surgery, with the diagnosis of Dysphagia abnornal UGI.  The various methods of treatment have been discussed with the patient and family. After consideration of risks, benefits and other options for treatment, the patient has consented to  Procedure(s): ESOPHAGOGASTRODUODENOSCOPY (EGD) WITH PROPOFOL (N/A) as a surgical intervention.  The patient's history has been reviewed, patient examined, no change in status, stable for surgery.  I have reviewed the patient's chart and labs.  Questions were answered to the patient's satisfaction.     Lesly Rubenstein  Ok to proceed with EGD

## 2022-01-10 ENCOUNTER — Encounter: Payer: Self-pay | Admitting: Gastroenterology

## 2022-01-10 LAB — SURGICAL PATHOLOGY

## 2022-01-12 DIAGNOSIS — R0602 Shortness of breath: Secondary | ICD-10-CM | POA: Insufficient documentation

## 2022-01-12 DIAGNOSIS — R42 Dizziness and giddiness: Secondary | ICD-10-CM | POA: Insufficient documentation

## 2022-02-01 DIAGNOSIS — F17200 Nicotine dependence, unspecified, uncomplicated: Secondary | ICD-10-CM | POA: Insufficient documentation

## 2022-02-02 ENCOUNTER — Ambulatory Visit: Payer: Medicare Other | Admitting: Dermatology

## 2022-02-07 ENCOUNTER — Other Ambulatory Visit: Payer: Self-pay | Admitting: Internal Medicine

## 2022-02-07 DIAGNOSIS — R0602 Shortness of breath: Secondary | ICD-10-CM

## 2022-02-15 ENCOUNTER — Other Ambulatory Visit (HOSPITAL_COMMUNITY): Payer: Self-pay | Admitting: Student

## 2022-02-15 ENCOUNTER — Other Ambulatory Visit: Payer: Self-pay | Admitting: Student

## 2022-02-15 DIAGNOSIS — G459 Transient cerebral ischemic attack, unspecified: Secondary | ICD-10-CM

## 2022-02-15 DIAGNOSIS — Z8249 Family history of ischemic heart disease and other diseases of the circulatory system: Secondary | ICD-10-CM

## 2022-02-22 ENCOUNTER — Other Ambulatory Visit: Payer: Self-pay | Admitting: Internal Medicine

## 2022-02-22 ENCOUNTER — Encounter
Admission: RE | Admit: 2022-02-22 | Discharge: 2022-02-22 | Disposition: A | Discharge status: Home / Self Care | Payer: Medicare Other | Source: Ambulatory Visit | Attending: Internal Medicine | Admitting: Internal Medicine

## 2022-02-22 ENCOUNTER — Ambulatory Visit
Admission: RE | Admit: 2022-02-22 | Discharge: 2022-02-22 | Disposition: A | Discharge status: Home / Self Care | Payer: Medicare Other | Source: Ambulatory Visit | Attending: Student | Admitting: Student

## 2022-02-22 ENCOUNTER — Ambulatory Visit
Admission: RE | Admit: 2022-02-22 | Discharge: 2022-02-22 | Disposition: A | Discharge status: Home / Self Care | Payer: Medicare Other | Source: Ambulatory Visit | Attending: Internal Medicine | Admitting: Internal Medicine

## 2022-02-22 ENCOUNTER — Other Ambulatory Visit: Payer: Medicare Other

## 2022-02-22 DIAGNOSIS — G459 Transient cerebral ischemic attack, unspecified: Secondary | ICD-10-CM | POA: Diagnosis present

## 2022-02-22 DIAGNOSIS — Z8249 Family history of ischemic heart disease and other diseases of the circulatory system: Secondary | ICD-10-CM | POA: Diagnosis present

## 2022-02-22 DIAGNOSIS — R0602 Shortness of breath: Secondary | ICD-10-CM | POA: Diagnosis present

## 2022-02-22 MED ORDER — TECHNETIUM TC 99M TETROFOSMIN IV KIT
9.9100 | PACK | Freq: Once | INTRAVENOUS | Status: AC | PRN
  Administered 2022-02-22: 9.91 via INTRAVENOUS

## 2022-02-22 MED ORDER — REGADENOSON 0.4 MG/5ML IV SOLN
0.4000 mg | Freq: Once | INTRAVENOUS | Status: AC
  Administered 2022-02-22: 0.4 mg via INTRAVENOUS
  Filled 2022-02-22: qty 5

## 2022-02-22 MED ORDER — TECHNETIUM TC 99M TETROFOSMIN IV KIT
30.5500 | PACK | Freq: Once | INTRAVENOUS | Status: AC | PRN
  Administered 2022-02-22: 30.55 via INTRAVENOUS

## 2022-02-23 ENCOUNTER — Ambulatory Visit: Admission: RE | Admit: 2022-02-23 | Payer: Medicare Other | Source: Ambulatory Visit

## 2022-03-21 LAB — NM MYOCAR MULTI W/SPECT W/WALL MOTION / EF
Estimated workload: 1
Exercise duration (sec): 58 s
LV dias vol: 65 mL (ref 62–150)
LV sys vol: 14 mL
MPHR: 149 {beats}/min
Nuc Stress EF: 78 %
Peak HR: 91 {beats}/min
Percent HR: 61 %
Rest HR: 68 {beats}/min
Rest Nuclear Isotope Dose: 9.9 mCi
SDS: 4
SRS: 2
SSS: 6
ST Depression (mm): 0 mm
Stress Nuclear Isotope Dose: 30.6 mCi
TID: 0.98

## 2022-04-22 IMAGING — MR MR MRV HEAD WO/W CM
4 of 6 series · 20 of 48 positions shown · IV contrast (10ml Gadavist)
Comparison: None.

CLINICAL DATA: Double vision, right-sided headache

EXAM:
MR VENOGRAM HEAD WITHOUT AND WITH CONTRAST
TECHNIQUE: Angiographic images of the intracranial venous structures were
obtained using MRV technique without and with intravenous contrast.
CONTRAST:  10mL GADAVIST GADOBUTROL 1 MMOL/ML IV SOLN

[Series 5: TOF · coronal · 2.5mm · 0.98mm/px · 5 of 110 slices shown]
[im 1/110]
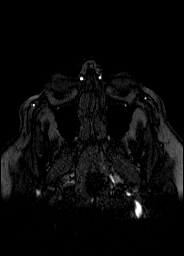
[im 28/110]
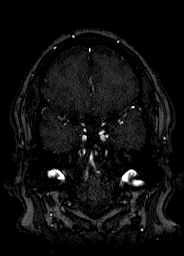
[im 55/110]
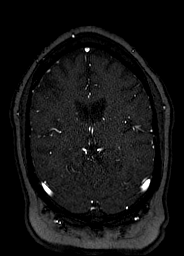
[im 82/110]
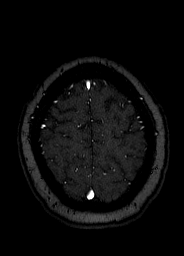
[im 110/110]
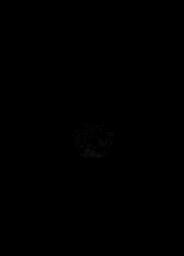

[Series 12: T1 · sagittal · 1.0mm · 0.98mm/px · 8 of 176 slices shown]
[im 1/176]
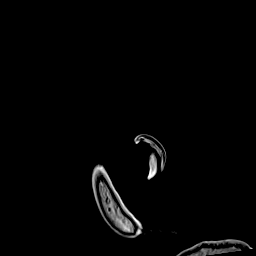
[im 26/176]
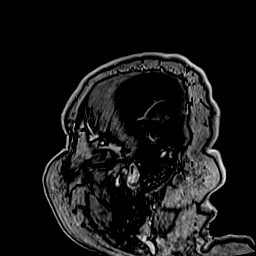
[im 51/176]
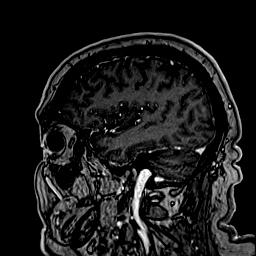
[im 76/176]
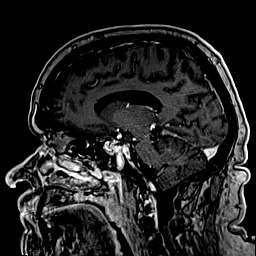
[im 101/176]
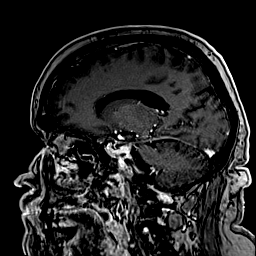
[im 126/176]
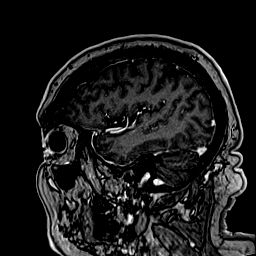
[im 151/176]
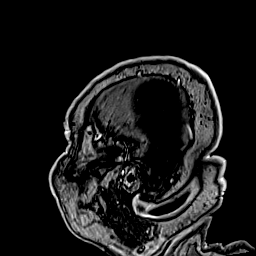
[im 176/176]
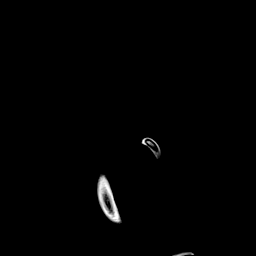

[Series 1037: mpr tra 1mm · axial · 1.0mm · 0.31mm/px · z∈[-108,+49]mm · 4 of 185 slices shown]
[im 1/185]
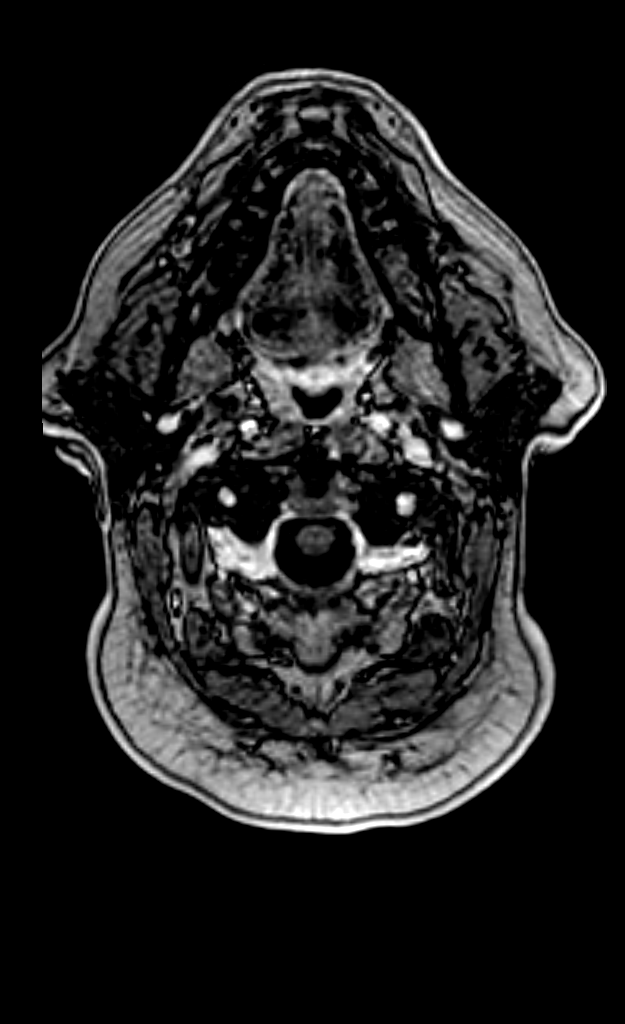
[im 24/185]
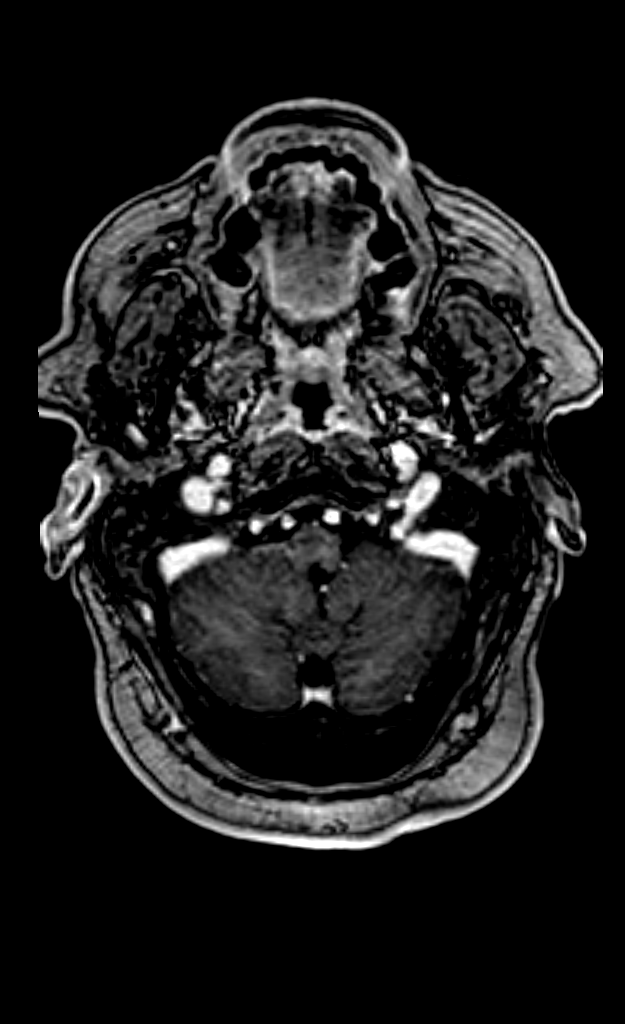
[im 93/185]
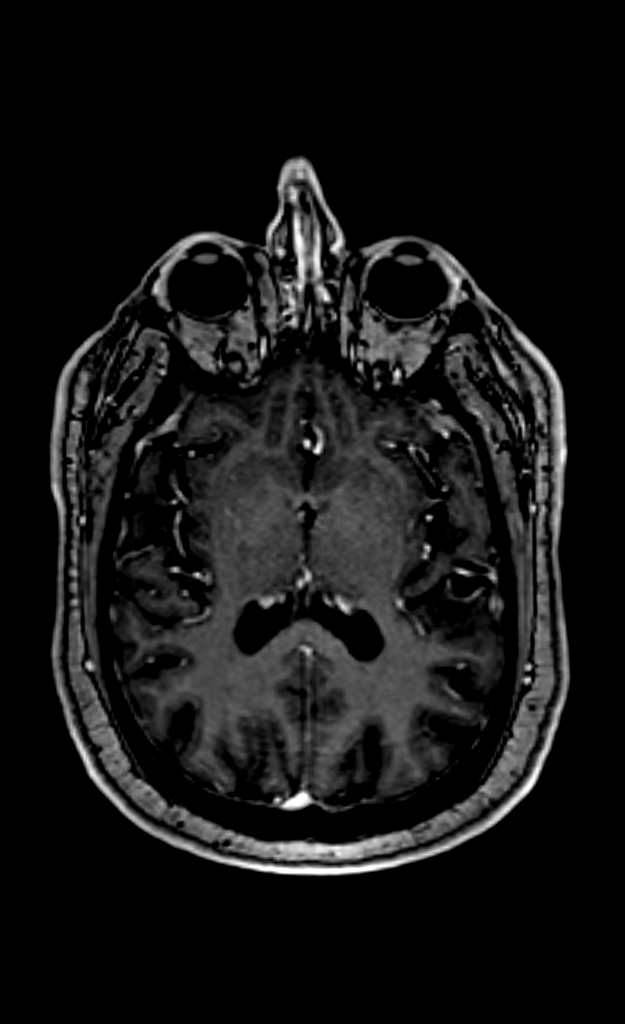
[im 162/185]
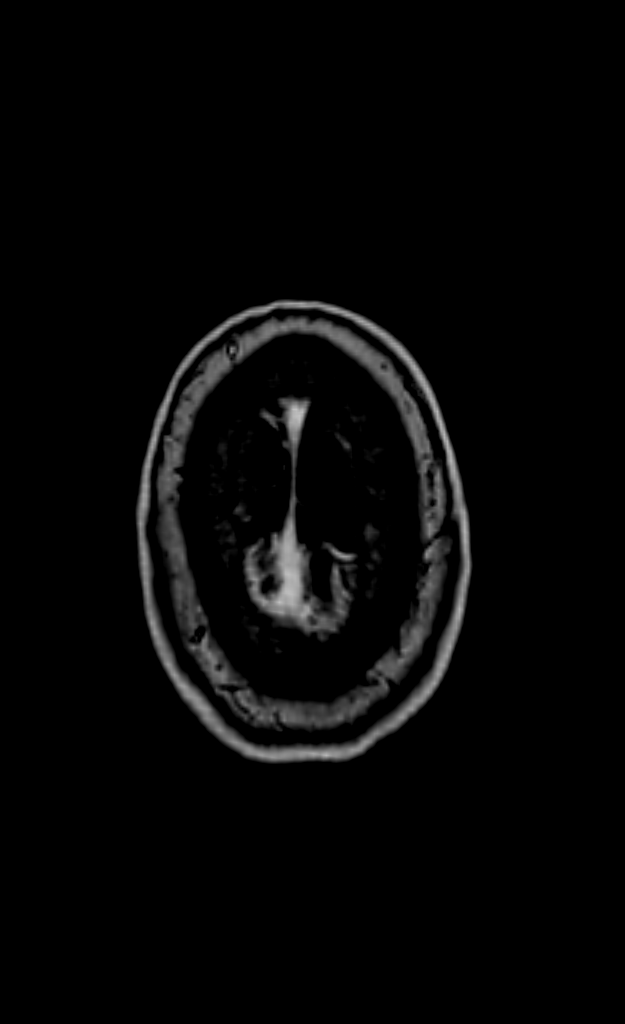

[Series 1043: mpr cor 1mm · coronal · 1.0mm · 0.32mm/px · 3 of 249 slices shown]
[im 46/249]
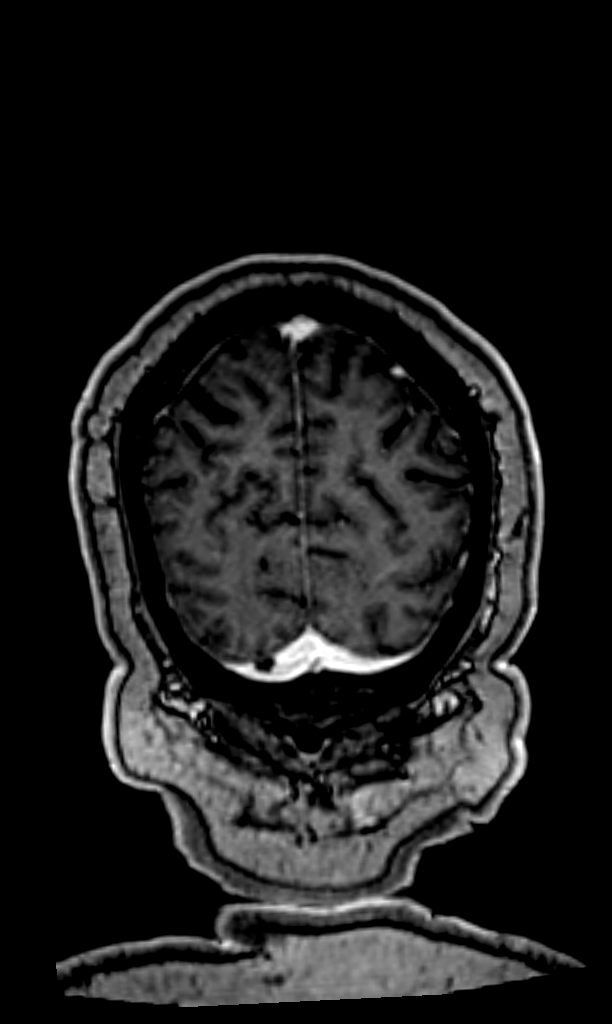
[im 136/249]
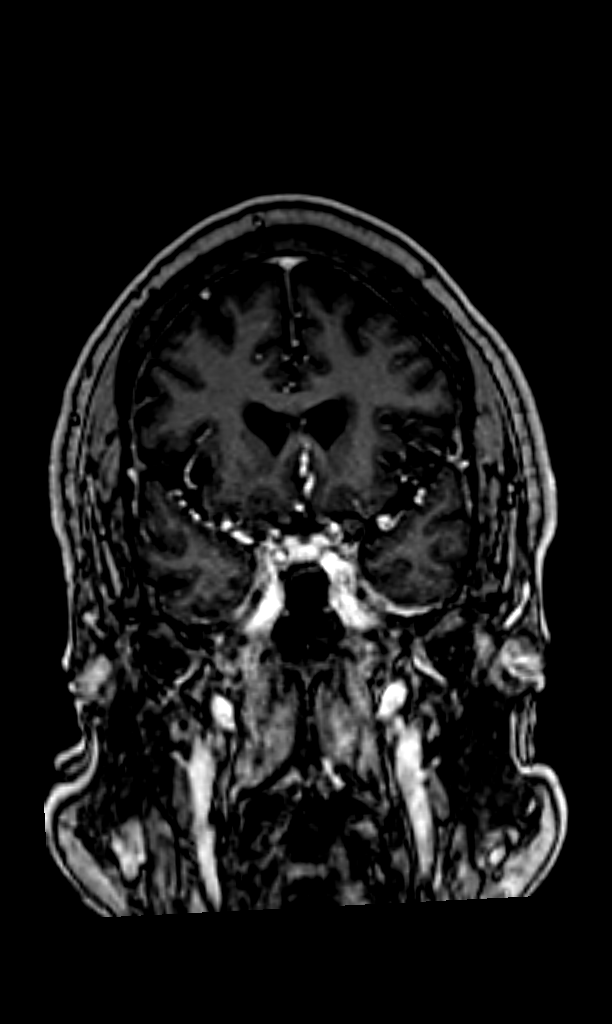
[im 226/249]
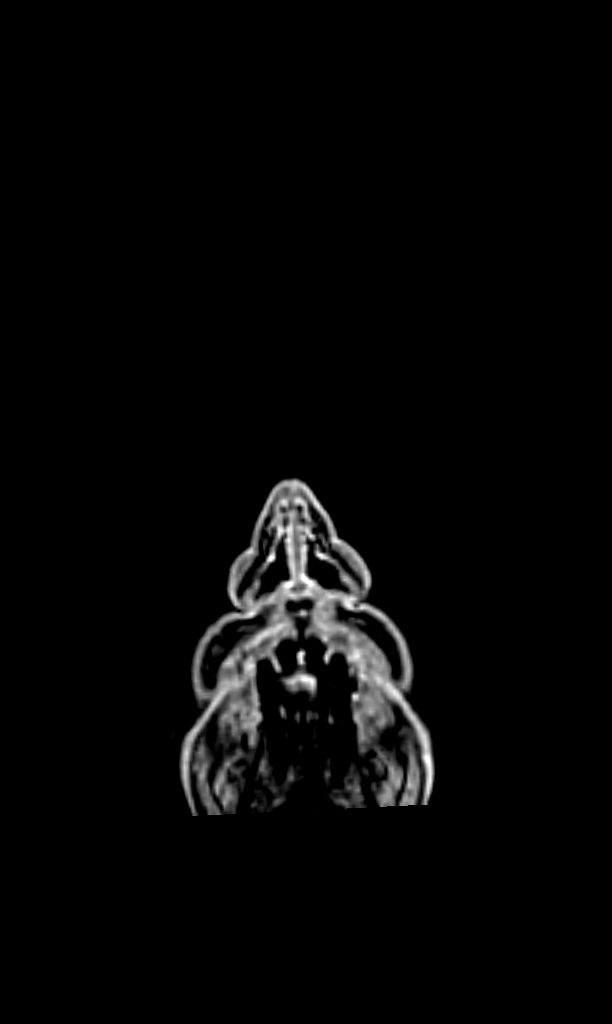

[20 of 48 positions shown; findings below may reference images not displayed]

FINDINGS: Superior sagittal sinus, straight sinus, vein of [place], inferior
sagittal sinus, and internal cerebral veins are patent. Both
transverse and sigmoid sinuses are patent.
IMPRESSION: No evidence of dural sinus thrombosis.

## 2023-02-07 ENCOUNTER — Ambulatory Visit: Payer: Medicare Other | Admitting: Podiatry

## 2023-02-09 ENCOUNTER — Ambulatory Visit (INDEPENDENT_AMBULATORY_CARE_PROVIDER_SITE_OTHER): Payer: Medicare Other | Admitting: Podiatry

## 2023-02-09 ENCOUNTER — Encounter: Payer: Self-pay | Admitting: Podiatry

## 2023-02-09 VITALS — BP 148/96 | HR 72

## 2023-02-09 DIAGNOSIS — B351 Tinea unguium: Secondary | ICD-10-CM

## 2023-02-09 DIAGNOSIS — M79675 Pain in left toe(s): Secondary | ICD-10-CM | POA: Diagnosis not present

## 2023-02-09 MED ORDER — TERBINAFINE HCL 250 MG PO TABS: 250.0000 mg | ORAL_TABLET | Freq: Every day | ORAL | 0 refills | Status: AC

## 2023-02-09 MED ORDER — TERBINAFINE HCL 250 MG PO TABS: 250.0000 mg | ORAL_TABLET | Freq: Every day | ORAL | 0 refills | Status: DC

## 2023-02-09 NOTE — Progress Notes (Signed)
   Chief Complaint  Patient presents with   Nail Problem    "I got that fungus in my toe." N - toenail fungus L - hallux D - 1 year O - gradually worse C - thick, hard, painful, discolored A - cutting it T - I put a medicated bandaid on it for 14 days    Subjective: 72 y.o. male presenting today as a new patient for evaluation of discoloration with thickening to the left hallux nail plate.  Onset about 1 year ago.  Patient states that he does have a history of fungal nails.  In the past he has taken oral Lamisil which cleared up the toenail fungus.  Presenting for further treatment evaluation  Past Medical History:  Diagnosis Date   Basal cell carcinoma 03/26/2014   Left upper back post. shoulder inf. Superficial.   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 shoulder sup. SCCis arising in LK.    Past Surgical History:  Procedure Laterality Date   APPENDECTOMY     COLONOSCOPY     COLONOSCOPY WITH PROPOFOL N/A 11/10/2016   Procedure: COLONOSCOPY WITH PROPOFOL;  Surgeon: Scot Jun, MD;  Location[facility] ENDOSCOPY;  Service: Endoscopy;  Laterality: N/A;   COLONOSCOPY WITH PROPOFOL N/A 07/07/2020   Procedure: COLONOSCOPY WITH PROPOFOL;  Surgeon: Toledo, Boykin Nearing, MD;  Location: [facility] ENDOSCOPY;  Service: Gastroenterology;  Laterality: N/A;  DM   ESOPHAGOGASTRODUODENOSCOPY (EGD) WITH PROPOFOL N/A 01/09/2022   Procedure: ESOPHAGOGASTRODUODENOSCOPY (EGD) WITH PROPOFOL;  Surgeon: Regis Bill, MD;  Location: [facility] ENDOSCOPY;  Service: Endoscopy;  Laterality: N/A;   HERNIA REPAIR      Allergies  Allergen Reactions   Sulfa Antibiotics Anaphylaxis   Codeine Nausea And Vomiting    Objective: Physical Exam General: The patient is alert and oriented x3 in no acute distress.  Dermatology: Hyperkeratotic, discolored, thickened, onychodystrophy noted to  the left hallux nail plate with associated tenderness. Skin is warm, dry and supple bilateral lower extremities. Negative for open lesions or macerations.  Vascular: Palpable pedal pulses bilaterally. No edema or erythema noted. Capillary refill within normal limits.  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 -Today we discussed different treatment options including oral, topical, and laser antifungal treatment modalities.  We discussed their efficacies and side effects.  Patient opts for oral antifungal treatment modality -Prescription for Lamisil 250 mg #90 daily. Pt denies a history of liver pathology or symptoms.  CMP on 02/07/2023, hepatic function WNL -Return to clinic as needed   Felecia Shelling, DPM [facility]  Dr. Felecia Shelling, DPM    [address] [place], [place] [address]                Office [phone]  Fax [phone]

## 2023-04-12 ENCOUNTER — Other Ambulatory Visit: Payer: Self-pay | Admitting: Neurology

## 2023-04-12 DIAGNOSIS — I671 Cerebral aneurysm, nonruptured: Secondary | ICD-10-CM

## 2023-04-27 ENCOUNTER — Ambulatory Visit
Admission: RE | Admit: 2023-04-27 | Discharge: 2023-04-27 | Disposition: A | Discharge status: Home / Self Care | Payer: Medicare Other | Source: Ambulatory Visit | Attending: Neurology | Admitting: Neurology

## 2023-04-27 DIAGNOSIS — I671 Cerebral aneurysm, nonruptured: Secondary | ICD-10-CM

## 2023-04-30 ENCOUNTER — Ambulatory Visit: Payer: Medicare Other | Admitting: Dermatology

## 2023-04-30 ENCOUNTER — Encounter: Payer: Self-pay | Admitting: Dermatology

## 2023-04-30 DIAGNOSIS — D0461 Carcinoma in situ of skin of right upper limb, including shoulder: Secondary | ICD-10-CM

## 2023-04-30 DIAGNOSIS — D492 Neoplasm of unspecified behavior of bone, soft tissue, and skin: Secondary | ICD-10-CM | POA: Diagnosis not present

## 2023-04-30 DIAGNOSIS — L578 Other skin changes due to chronic exposure to nonionizing radiation: Secondary | ICD-10-CM

## 2023-04-30 DIAGNOSIS — W908XXA Exposure to other nonionizing radiation, initial encounter: Secondary | ICD-10-CM | POA: Diagnosis not present

## 2023-04-30 DIAGNOSIS — C4492 Squamous cell carcinoma of skin, unspecified: Secondary | ICD-10-CM

## 2023-04-30 DIAGNOSIS — D0462 Carcinoma in situ of skin of left upper limb, including shoulder: Secondary | ICD-10-CM | POA: Diagnosis not present

## 2023-04-30 DIAGNOSIS — L570 Actinic keratosis: Secondary | ICD-10-CM

## 2023-04-30 DIAGNOSIS — Z85828 Personal history of other malignant neoplasm of skin: Secondary | ICD-10-CM | POA: Diagnosis not present

## 2023-04-30 DIAGNOSIS — D099 Carcinoma in situ, unspecified: Secondary | ICD-10-CM

## 2023-04-30 HISTORY — DX: Carcinoma in situ, unspecified: D09.9

## 2023-04-30 HISTORY — DX: Squamous cell carcinoma of skin, unspecified: C44.92

## 2023-04-30 NOTE — Patient Instructions (Addendum)
 Cryotherapy Aftercare  Wash gently with soap and water everyday.   Apply Vaseline jelly daily until healed.    Wound Care Instructions  Cleanse wound gently with soap and water once a day then pat dry with clean gauze. Apply a thin coat of Petrolatum (petroleum jelly, "Vaseline") over the wound (unless you have an allergy to this). We recommend that you use a new, sterile tube of Vaseline. Do not pick or remove scabs. Do not remove the yellow or white "healing tissue" from the base of the wound.  Cover the wound with fresh, clean, nonstick gauze and secure with paper tape. You may use Band-Aids in place of gauze and tape if the wound is small enough, but would recommend trimming much of the tape off as there is often too much. Sometimes Band-Aids can irritate the skin.  You should call the office for your biopsy report after 1 week if you have not already been contacted.  If you experience any problems, such as abnormal amounts of bleeding, swelling, significant bruising, significant pain, or evidence of infection, please call the office immediately.  FOR ADULT SURGERY PATIENTS: If you need something for pain relief you may take 1 extra strength Tylenol (acetaminophen) AND 2 Ibuprofen (200mg  each) together every 4 hours as needed for pain. (do not take these if you are allergic to them or if you have a reason you should not take them.) Typically, you may only need pain medication for 1 to 3 days.       Recommend daily broad spectrum sunscreen SPF 30+ to sun-exposed areas, reapply every 2 hours as needed. Call for new or changing lesions.  Staying in the shade or wearing long sleeves, sun glasses (UVA+UVB protection) and wide brim hats (4-inch brim around the entire circumference of the hat) are also recommended for sun protection.      Due to recent changes in healthcare laws, you may see results of your pathology and/or laboratory studies on MyChart before the doctors have had a chance  to review them. We understand that in some cases there may be results that are confusing or concerning to you. Please understand that not all results are received at the same time and often the doctors may need to interpret multiple results in order to provide you with the best plan of care or course of treatment. Therefore, we ask that you please give [place] 2 business days to thoroughly review all your results before contacting the office for clarification. Should we see a critical lab result, you will be contacted sooner.   If You Need Anything After Your Visit  If you have any questions or concerns for your doctor, please call our main line at [phone] and press option 4 to reach your doctor's medical assistant. If no one answers, please leave a voicemail as directed and we will return your call as soon as possible. Messages left after 4 pm will be answered the following business day.   You may also send [place] a message via MyChart. We typically respond to MyChart messages within 1-2 business days.  For prescription refills, please ask your pharmacy to contact our office. Our fax number is [phone].  If you have an urgent issue when the clinic is closed that cannot wait until the next business day, you can page your doctor at the number below.    Please note that while we do our best to be available for urgent issues outside of office hours, we are not available  24/7.   If you have an urgent issue and are unable to reach [place], you may choose to seek medical care at your doctor's office, retail clinic, urgent care center, or emergency room.  If you have a medical emergency, please immediately call 911 or go to the emergency department.  Pager Numbers  - Dr. Gwen Pounds: [phone]  - Dr. Roseanne Reno: [phone]  - Dr. Katrinka Blazing: [phone]   In the event of inclement weather, please call our main line at [phone] for an update on the status of any delays or closures.  Dermatology  Medication Tips: Please keep the boxes that topical medications come in in order to help keep track of the instructions about where and how to use these. Pharmacies typically print the medication instructions only on the boxes and not directly on the medication tubes.   If your medication is too expensive, please contact our office at [phone] option 4 or send [place] a message through MyChart.   We are unable to tell what your co-pay for medications will be in advance as this is different depending on your insurance coverage. However, we may be able to find a substitute medication at lower cost or fill out paperwork to get insurance to cover a needed medication.   If a prior authorization is required to get your medication covered by your insurance company, please allow [place] 1-2 business days to complete this process.  Drug prices often vary depending on where the prescription is filled and some pharmacies may offer cheaper prices.  The website www.goodrx.com contains coupons for medications through different pharmacies. The prices here do not account for what the cost may be with help from insurance (it may be cheaper with your insurance), but the website can give you the price if you did not use any insurance.  - You can print the associated coupon and take it with your prescription to the pharmacy.  - You may also stop by our office during regular business hours and pick up a GoodRx coupon card.  - If you need your prescription sent electronically to a different pharmacy, notify our office through [facility] or by phone at [phone] option 4.     Si Usted Necesita Algo Despus de Su Visita  Tambin puede enviarnos un mensaje a travs de Clinical cytogeneticist. Por lo general respondemos a los mensajes de MyChart en el transcurso de 1 a 2 das hbiles.  Para renovar recetas, por favor pida a su farmacia que se ponga en contacto con nuestra oficina. Annie Sable de fax es [place] [phone].  Si  tiene un asunto urgente cuando la clnica est cerrada y que no puede esperar hasta el siguiente da hbil, puede llamar/localizar a su doctor(a) al nmero que aparece a continuacin.   Por favor, tenga en cuenta que aunque hacemos todo lo posible para estar disponibles para asuntos urgentes fuera del horario de [place], no estamos disponibles las 24 horas del da, los 7 [address]  [address] de la [place].   Si tiene un problema urgente y no puede comunicarse con nosotros, puede optar por buscar atencin mdica  en el consultorio de su doctor(a), en una clnica privada, en un centro de atencin urgente o en una sala de emergencias.  Si tiene Engineer, drilling, por favor llame inmediatamente al 911 o vaya a la sala de emergencias.  Nmeros de bper  - Dr. Gwen Pounds: [phone]  - Dra. Roseanne Reno: [phone]  - Dr. Katrinka Blazing: [phone]   En caso de inclemencias del tiempo, por favor  llame a Ferne Coe lnea principal al [phone] para una actualizacin sobre el [place] de cualquier retraso o cierre.  Consejos para la medicacin en dermatologa: Por favor, guarde las cajas en las que vienen los medicamentos de uso tpico para ayudarle a seguir las instrucciones sobre dnde y cmo usarlos. Las farmacias generalmente imprimen las instrucciones del medicamento slo en las cajas y no directamente en los tubos del [place].   Si su medicamento es muy caro, por favor, pngase en contacto con Rolm Gala llamando al [phone] y presione la opcin 4 o envenos un mensaje a travs de Clinical cytogeneticist.   No podemos decirle cul ser su copago por los medicamentos por adelantado ya que esto es diferente dependiendo de la cobertura de su seguro. Sin embargo, es posible que podamos encontrar un medicamento sustituto a Audiological scientist un formulario para que el seguro cubra el medicamento que se considera necesario.   Si se requiere una autorizacin previa para que su compaa de seguros [place] su medicamento, por  favor permtanos de 1 a 2 das hbiles para completar [address]  Los precios de los medicamentos varan con frecuencia dependiendo del Environmental consultant de dnde se surte la receta y alguna farmacias pueden ofrecer precios ms baratos.  El sitio web www.goodrx.com tiene cupones para medicamentos de Health and safety inspector. Los precios aqu no tienen en cuenta lo que podra costar con la ayuda del seguro (puede ser ms barato con su seguro), pero el sitio web puede darle el precio si no utiliz Tourist information centre manager.  - Puede imprimir el cupn correspondiente y llevarlo con su receta a la farmacia.  - Tambin puede pasar por nuestra oficina durante el horario de atencin regular y Education officer, museum una tarjeta de cupones de GoodRx.  - Si necesita que su receta se enve electrnicamente a una farmacia diferente, informe a nuestra oficina a travs de MyChart de  o por telfono llamando al [phone] y presione la opcin 4.

## 2023-04-30 NOTE — Progress Notes (Signed)
Follow-Up Visit   Subjective  Jerry Huynh is a 72 y.o. male who presents for the following: Spots on arms and hands. Raised, rough. Increasing in number. Hx of BCC and SCC on back in the past.   The patient has spots, moles and lesions to be evaluated, some may be new or changing and the patient may have concern these could be cancer.    The following portions of the chart were reviewed this encounter and updated as appropriate: medications, allergies, medical history  Review of Systems:  No other skin or systemic complaints except as noted in HPI or Assessment and Plan.  Objective  Well appearing patient in no apparent distress; mood and affect are within normal limits.  A focused examination was performed of the following areas: Face, arms, hands, back  Relevant physical exam findings are noted in the Assessment and Plan.  dorsal hands and forearms x19 (19) Erythematous keratotic papules/macules with gritty scale.   Right Dorsal Hand 9 mm pink keratotic papule       Left Dorsal Proximal Forearm 7 mm pink keratotic papule         Assessment & Plan   AK (actinic keratosis) (19) dorsal hands and forearms x19  Actinic keratoses are precancerous spots that appear secondary to cumulative UV radiation exposure/sun exposure over time. They are chronic with expected duration over 1 year. A portion of actinic keratoses will progress to squamous cell carcinoma of the skin. It is not possible to reliably predict which spots will progress to skin cancer and so treatment is recommended to prevent development of skin cancer.  Recommend daily broad spectrum sunscreen SPF 30+ to sun-exposed areas, reapply every 2 hours as needed.  Recommend staying in the shade or wearing long sleeves, sun glasses (UVA+UVB protection) and wide brim hats (4-inch brim around the entire circumference of the hat). Call for new or changing lesions.  Destruction of lesion - dorsal hands and  forearms x19 (19) Complexity: simple   Destruction method: cryotherapy   Informed consent: discussed and consent obtained   Timeout:  patient name, date of birth, surgical site, and procedure verified Lesion destroyed using liquid nitrogen: Yes   Region frozen until ice ball extended beyond lesion: Yes   Cryo cycles: 1 or 2. Outcome: patient tolerated procedure well with no complications   Post-procedure details: wound care instructions given   Additional details:  Prior to procedure, discussed risks of blister formation, small wound, skin dyspigmentation, or rare scar following cryotherapy. Recommend Vaseline ointment to treated areas while healing.   Neoplasm of skin (2) Right Dorsal Hand  Skin / nail biopsy Type of biopsy: tangential   Informed consent: discussed and consent obtained   Timeout: patient name, date of birth, surgical site, and procedure verified   Procedure prep:  Patient was prepped and draped in usual sterile fashion Prep type:  Isopropyl alcohol Anesthesia: the lesion was anesthetized in a standard fashion   Anesthetic:  1% lidocaine w/ epinephrine 1-100,000 buffered w/ 8.4% NaHCO3 Instrument used: DermaBlade   Hemostasis achieved with: pressure and aluminum chloride   Outcome: patient tolerated procedure well   Post-procedure details: sterile dressing applied and wound care instructions given   Dressing type: bandage and petrolatum    Specimen 1 - Surgical pathology Differential Diagnosis: R/O SCC  Check Margins: No  Left Dorsal Proximal Forearm  Skin / nail biopsy Type of biopsy: tangential   Informed consent: discussed and consent obtained   Timeout: patient name, date  of birth, surgical site, and procedure verified   Procedure prep:  Patient was prepped and draped in usual sterile fashion Prep type:  Isopropyl alcohol Anesthesia: the lesion was anesthetized in a standard fashion   Anesthetic:  1% lidocaine w/ epinephrine 1-100,000 buffered w/ 8.4%  NaHCO3 Instrument used: DermaBlade   Hemostasis achieved with: pressure and aluminum chloride   Outcome: patient tolerated procedure well   Post-procedure details: sterile dressing applied and wound care instructions given   Dressing type: bandage and petrolatum    Specimen 2 - Surgical pathology Differential Diagnosis: R/O SCC   Check Margins: No   ACTINIC DAMAGE WITH PRECANCEROUS ACTINIC KERATOSES Counseling for Topical Chemotherapy Management: Patient exhibits: - Severe, confluent actinic changes with pre-cancerous actinic keratoses that is secondary to cumulative UV radiation exposure over time - Condition that is severe; chronic, not at goal. - diffuse scaly erythematous macules and papules with underlying dyspigmentation - Discussed Prescription "Field Treatment" topical Chemotherapy for Severe, Chronic Confluent Actinic Changes with Pre-Cancerous Actinic Keratoses Field treatment involves treatment of an entire area of skin that has confluent Actinic Changes (Sun/ Ultraviolet light damage) and PreCancerous Actinic Keratoses by method of PhotoDynamic Therapy (PDT) and/or prescription Topical Chemotherapy agents such as 5-fluorouracil, 5-fluorouracil/calcipotriene, and/or imiquimod.  The purpose is to decrease the number of clinically evident and subclinical PreCancerous lesions to prevent progression to development of skin cancer by chemically destroying early precancer changes that may or may not be visible.  It has been shown to reduce the risk of developing skin cancer in the treated area. As a result of treatment, redness, scaling, crusting, and open sores may occur during treatment course. One or more than one of these methods may be used and may have to be used several times to control, suppress and eliminate the PreCancerous changes. Discussed treatment course, expected reaction, and possible side effects. - Recommend daily broad spectrum sunscreen SPF 30+ to sun-exposed areas,  reapply every 2 hours as needed.  - Staying in the shade or wearing long sleeves, sun glasses (UVA+UVB protection) and wide brim hats (4-inch brim around the entire circumference of the hat) are also recommended. - Call for new or changing lesions.   HISTORY OF BASAL CELL CARCINOMA OF THE SKIN. Left upper back. 2015 - No evidence of recurrence today - Recommend regular full body skin exams - Recommend daily broad spectrum sunscreen SPF 30+ to sun-exposed areas, reapply every 2 hours as needed.  - Call if any new or changing lesions are noted between office visits  HISTORY OF SQUAMOUS CELL CARCINOMA IN SITU OF THE SKIN. Left upper back. 2015 - No evidence of recurrence today - Recommend regular full body skin exams - Recommend daily broad spectrum sunscreen SPF 30+ to sun-exposed areas, reapply every 2 hours as needed.  - Call if any new or changing lesions are noted between office visits   Return in about 6 months (around 10/29/2023) for FBSE.  I, Lawson Radar, CMA, am acting as scribe for Elie Goody, MD.   Documentation: I have reviewed the above documentation for accuracy and completeness, and I agree with the above.  Elie Goody, MD

## 2023-05-03 LAB — SURGICAL PATHOLOGY

## 2023-05-07 ENCOUNTER — Telehealth: Payer: Self-pay

## 2023-05-07 NOTE — Telephone Encounter (Signed)
-----   Message from [facility] sent at 05/07/2023  1:47 PM EST ----- Diagnosis 1. Skin, right dorsal hand :       SQUAMOUS CELL CARCINOMA IN SITU, BASE INVOLVED        2. Skin, left dorsal proximal forearm :       SQUAMOUS CELL CARCINOMA IN SITU, BASE INVOLVED     Please call with diagnosis and message me with patient's decision on treatment.   Explanation: the biopsies from your right hand and left forearm show squamous cell skin cancers limited to the top layer of skin. This means they are early cancers and have not spread. However, they have the potential to spread beyond the skin and threaten your health, so we recommend treating them.   Treatment option 1: a cream (fluorouracil and calcipotriene) that helps your immune system clear the skin cancer. It will cause redness and irritation. Wait two weeks after the biopsy to start applying the cream. Apply the cream twice per day until the redness and irritation develop (usually occurs by day 7), then stop and allow it to heal. We will recheck the area in 2 months to ensure the cancer is gone. The cream is $35 plus shipping and will be mailed to you from a low cost compounding pharmacy.  Treatment option 2: you return for a brief appointment where I perform electrodesiccation and curettage [facility]). This involves three rounds of scraping and burning to destroy the skin cancer. It has about an 85% cure rate and leaves a round wound slightly larger than the skin cancer and leaves a round white scar. No additional pathology is done. If the skin cancer comes back, we would need to do a surgery to remove it.

## 2023-05-21 ENCOUNTER — Telehealth: Payer: Self-pay

## 2023-05-21 NOTE — Telephone Encounter (Signed)
-----   Message from [facility] sent at 05/07/2023  1:47 PM EST ----- Diagnosis 1. Skin, right dorsal hand :       SQUAMOUS CELL CARCINOMA IN SITU, BASE INVOLVED        2. Skin, left dorsal proximal forearm :       SQUAMOUS CELL CARCINOMA IN SITU, BASE INVOLVED     Please call with diagnosis and message me with patient's decision on treatment.   Explanation: the biopsies from your right hand and left forearm show squamous cell skin cancers limited to the top layer of skin. This means they are early cancers and have not spread. However, they have the potential to spread beyond the skin and threaten your health, so we recommend treating them.   Treatment option 1: a cream (fluorouracil and calcipotriene) that helps your immune system clear the skin cancer. It will cause redness and irritation. Wait two weeks after the biopsy to start applying the cream. Apply the cream twice per day until the redness and irritation develop (usually occurs by day 7), then stop and allow it to heal. We will recheck the area in 2 months to ensure the cancer is gone. The cream is $35 plus shipping and will be mailed to you from a low cost compounding pharmacy.  Treatment option 2: you return for a brief appointment where I perform electrodesiccation and curettage [facility]). This involves three rounds of scraping and burning to destroy the skin cancer. It has about an 85% cure rate and leaves a round wound slightly larger than the skin cancer and leaves a round white scar. No additional pathology is done. If the skin cancer comes back, we would need to do a surgery to remove it.

## 2023-05-21 NOTE — Telephone Encounter (Signed)
Left pt msg to call for bx result/sh °

## 2023-06-07 ENCOUNTER — Telehealth: Payer: Self-pay

## 2023-06-07 NOTE — Telephone Encounter (Signed)
-----   Message from [facility] sent at 05/07/2023  1:47 PM EST ----- Diagnosis 1. Skin, right dorsal hand :       SQUAMOUS CELL CARCINOMA IN SITU, BASE INVOLVED        2. Skin, left dorsal proximal forearm :       SQUAMOUS CELL CARCINOMA IN SITU, BASE INVOLVED     Please call with diagnosis and message me with patient's decision on treatment.   Explanation: the biopsies from your right hand and left forearm show squamous cell skin cancers limited to the top layer of skin. This means they are early cancers and have not spread. However, they have the potential to spread beyond the skin and threaten your health, so we recommend treating them.   Treatment option 1: a cream (fluorouracil and calcipotriene) that helps your immune system clear the skin cancer. It will cause redness and irritation. Wait two weeks after the biopsy to start applying the cream. Apply the cream twice per day until the redness and irritation develop (usually occurs by day 7), then stop and allow it to heal. We will recheck the area in 2 months to ensure the cancer is gone. The cream is $35 plus shipping and will be mailed to you from a low cost compounding pharmacy.  Treatment option 2: you return for a brief appointment where I perform electrodesiccation and curettage [facility]). This involves three rounds of scraping and burning to destroy the skin cancer. It has about an 85% cure rate and leaves a round wound slightly larger than the skin cancer and leaves a round white scar. No additional pathology is done. If the skin cancer comes back, we would need to do a surgery to remove it.

## 2023-06-07 NOTE — Telephone Encounter (Signed)
Left pt msg to call for bx results/sh 

## 2023-06-21 ENCOUNTER — Telehealth: Payer: Self-pay

## 2023-06-21 NOTE — Telephone Encounter (Signed)
Patient called and left message on nurse line regarding a letter he had received and treatment options.  Tried calling patient back for more clarification on treatment he elected. No answer. LM for patient to call back

## 2023-08-13 ENCOUNTER — Ambulatory Visit (INDEPENDENT_AMBULATORY_CARE_PROVIDER_SITE_OTHER): Admitting: Dermatology

## 2023-08-13 ENCOUNTER — Encounter: Payer: Self-pay | Admitting: Dermatology

## 2023-08-13 DIAGNOSIS — D0461 Carcinoma in situ of skin of right upper limb, including shoulder: Secondary | ICD-10-CM

## 2023-08-13 DIAGNOSIS — L739 Follicular disorder, unspecified: Secondary | ICD-10-CM | POA: Diagnosis not present

## 2023-08-13 DIAGNOSIS — L570 Actinic keratosis: Secondary | ICD-10-CM

## 2023-08-13 DIAGNOSIS — E119 Type 2 diabetes mellitus without complications: Secondary | ICD-10-CM | POA: Insufficient documentation

## 2023-08-13 DIAGNOSIS — D099 Carcinoma in situ, unspecified: Secondary | ICD-10-CM

## 2023-08-13 DIAGNOSIS — D0462 Carcinoma in situ of skin of left upper limb, including shoulder: Secondary | ICD-10-CM | POA: Diagnosis not present

## 2023-08-13 DIAGNOSIS — W908XXA Exposure to other nonionizing radiation, initial encounter: Secondary | ICD-10-CM

## 2023-08-13 NOTE — Progress Notes (Signed)
   Follow-Up Visit   Subjective  Jerry Huynh is a 73 y.o. male who presents for the following: patient with bx proven SCCis at right dorsal hand and left dorsal proximal forearm that have not been treated. Patient prefers EDC > 5FU.  The patient has spots, moles and lesions to be evaluated, some may be new or changing and the patient may have concern these could be cancer.   The following portions of the chart were reviewed this encounter and updated as appropriate: medications, allergies, medical history  Review of Systems:  No other skin or systemic complaints except as noted in HPI or Assessment and Plan.  Objective  Well appearing patient in no apparent distress; mood and affect are within normal limits.   A focused examination was performed of the following areas: Arms, hands, scalp  Relevant exam findings are noted in the Assessment and Plan.  Left Dorsal Proximal Forearm Clear s/p biopsy Right Dorsal Hand Clear s/p biopsy R dorsal hand x 4, R mid extensor forearm x 1, R forearm x 8, L dorsal hand x 10, L forearm x 6, R tragus x 1, L zygoma x 1, L nasal dorsum x 1 (32) Erythematous thin papules/macules with gritty scale.   Assessment & Plan   Folliculitis  Exam: scattered Inflamed follicular papules on scalp  Plan: Hibiclens wash daily in shower  SQUAMOUS CELL CARCINOMA IN SITU (2) Left Dorsal Proximal Forearm Right Dorsal Hand AK (ACTINIC KERATOSIS) (32) R dorsal hand x 4, R mid extensor forearm x 1, R forearm x 8, L dorsal hand x 10, L forearm x 6, R tragus x 1, L zygoma x 1, L nasal dorsum x 1 (32) Actinic keratoses are precancerous spots that appear secondary to cumulative UV radiation exposure/sun exposure over time. They are chronic with expected duration over 1 year. A portion of actinic keratoses will progress to squamous cell carcinoma of the skin. It is not possible to reliably predict which spots will progress to skin cancer and so treatment is recommended  to prevent development of skin cancer.  Recommend daily broad spectrum sunscreen SPF 30+ to sun-exposed areas, reapply every 2 hours as needed.  Recommend staying in the shade or wearing long sleeves, sun glasses (UVA+UVB protection) and wide brim hats (4-inch brim around the entire circumference of the hat). Call for new or changing lesions.  Recheck right mid extensor forearm on follow up and plan bx if not resolved after LN2 Destruction of lesion - R dorsal hand x 4, R mid extensor forearm x 1, R forearm x 8, L dorsal hand x 10, L forearm x 6, R tragus x 1, L zygoma x 1, L nasal dorsum x 1 (32) Complexity: simple   Destruction method: cryotherapy   Informed consent: discussed and consent obtained   Timeout:  patient name, date of birth, surgical site, and procedure verified Lesion destroyed using liquid nitrogen: Yes   Region frozen until ice ball extended beyond lesion: Yes   Cryo cycles: 1 or 2. Outcome: patient tolerated procedure well with no complications   Post-procedure details: wound care instructions given   FOLLICULITIS    Return in about 6 months (around 02/13/2024) for TBSE, with Dr. Katrinka Blazing, HxBCC, HxSCC, HxAK.  Anise Salvo, RMA, am acting as scribe for Elie Goody, MD .   Documentation: I have reviewed the above documentation for accuracy and completeness, and I agree with the above.  Elie Goody, MD

## 2023-08-13 NOTE — Patient Instructions (Addendum)

## 2023-10-29 ENCOUNTER — Ambulatory Visit (INDEPENDENT_AMBULATORY_CARE_PROVIDER_SITE_OTHER): Payer: Medicare Other | Admitting: Dermatology

## 2023-10-29 ENCOUNTER — Encounter: Payer: Self-pay | Admitting: Dermatology

## 2023-10-29 DIAGNOSIS — L821 Other seborrheic keratosis: Secondary | ICD-10-CM | POA: Diagnosis not present

## 2023-10-29 DIAGNOSIS — B351 Tinea unguium: Secondary | ICD-10-CM

## 2023-10-29 DIAGNOSIS — Z1283 Encounter for screening for malignant neoplasm of skin: Secondary | ICD-10-CM

## 2023-10-29 DIAGNOSIS — W908XXA Exposure to other nonionizing radiation, initial encounter: Secondary | ICD-10-CM | POA: Diagnosis not present

## 2023-10-29 DIAGNOSIS — L814 Other melanin hyperpigmentation: Secondary | ICD-10-CM | POA: Diagnosis not present

## 2023-10-29 DIAGNOSIS — L578 Other skin changes due to chronic exposure to nonionizing radiation: Secondary | ICD-10-CM | POA: Diagnosis not present

## 2023-10-29 DIAGNOSIS — L570 Actinic keratosis: Secondary | ICD-10-CM | POA: Diagnosis not present

## 2023-10-29 DIAGNOSIS — D229 Melanocytic nevi, unspecified: Secondary | ICD-10-CM

## 2023-10-29 DIAGNOSIS — L719 Rosacea, unspecified: Secondary | ICD-10-CM

## 2023-10-29 DIAGNOSIS — D1801 Hemangioma of skin and subcutaneous tissue: Secondary | ICD-10-CM

## 2023-10-29 DIAGNOSIS — Z85828 Personal history of other malignant neoplasm of skin: Secondary | ICD-10-CM

## 2023-10-29 NOTE — Patient Instructions (Signed)

## 2023-10-29 NOTE — Progress Notes (Signed)
 Follow-Up Visit   Subjective  Jerry Huynh is a 73 y.o. male who presents for the following: Skin Cancer Screening and Full Body Skin Exam  The patient presents for Total-Body Skin Exam (TBSE) for skin cancer screening and mole check. The patient has spots, moles and lesions to be evaluated, some may be new or changing and the patient may have concern these could be cancer.   The following portions of the chart were reviewed this encounter and updated as appropriate: medications, allergies, medical history  Review of Systems:  No other skin or systemic complaints except as noted in HPI or Assessment and Plan.  Objective  Well appearing patient in no apparent distress; mood and affect are within normal limits.  A full examination was performed including scalp, head, eyes, ears, nose, lips, neck, chest, axillae, abdomen, back, buttocks, bilateral upper extremities, bilateral lower extremities, hands, feet, fingers, toes, fingernails, and toenails. All findings within normal limits unless otherwise noted below.   Relevant physical exam findings are noted in the Assessment and Plan. L mid cheek x 1, L mid helix x 1, L medial cheek x 1, R temple x 1, R nasal side wall x 1, R dorsal hand x 2, R forearm x 3, L dorsal hand x 2, L forearm x 5, L upper arm x 1 (18) Erythematous thin papules/macules with gritty scale.   Assessment & Plan   SKIN CANCER SCREENING PERFORMED TODAY.  ACTINIC DAMAGE - Chronic condition, secondary to cumulative UV/sun exposure - diffuse scaly erythematous macules with underlying dyspigmentation - Recommend daily broad spectrum sunscreen SPF 30+ to sun-exposed areas, reapply every 2 hours as needed.  - Staying in the shade or wearing long sleeves, sun glasses (UVA+UVB protection) and wide brim hats (4-inch brim around the entire circumference of the hat) are also recommended for sun protection.  - Call for new or changing lesions.  LENTIGINES, SEBORRHEIC KERATOSES,  HEMANGIOMAS - Benign normal skin lesions - Benign-appearing - Call for any changes  MELANOCYTIC NEVI - Tan-brown and/or pink-flesh-colored symmetric macules and papules - Benign appearing on exam today - Observation - Call clinic for new or changing moles - Recommend daily use of broad spectrum spf 30+ sunscreen to sun-exposed areas.   HISTORY OF BASAL CELL CARCINOMA OF THE SKIN - L upper back post inf shoulder 03/26/14 - No evidence of recurrence today - Recommend regular full body skin exams - Recommend daily broad spectrum sunscreen SPF 30+ to sun-exposed areas, reapply every 2 hours as needed.  - Call if any new or changing lesions are noted between office visits  HISTORY OF SQUAMOUS CELL CARCINOMA OF THE SKIN - R hand dorsal and L dorsal prox forearm - clear on 08/13/23, no further tx needed. - No evidence of recurrence today - No lymphadenopathy - Recommend regular full body skin exams - Recommend daily broad spectrum sunscreen SPF 30+ to sun-exposed areas, reapply every 2 hours as needed.  - Call if any new or changing lesions are noted between office visits  ROSACEA with actinic damage Exam Mid face erythema with telangiectasias  Chronic and persistent condition with duration or expected duration over one year. Condition is symptomatic/ bothersome to patient. Not currently at goal.  Rosacea is a chronic progressive skin condition usually affecting the face of adults, causing redness and/or acne bumps. It is treatable but not curable. It sometimes affects the eyes (ocular rosacea) as well. It may respond to topical and/or systemic medication and can flare with stress, sun  exposure, alcohol, exercise, topical steroids (including hydrocortisone/cortisone 10) and some foods.  Daily application of broad spectrum spf 30+ sunscreen to face is recommended to reduce flares.  Treatment Plan Benign, no tx needed AK (ACTINIC KERATOSIS) (18) L mid cheek x 1, L mid helix x 1, L medial  cheek x 1, R temple x 1, R nasal side wall x 1, R dorsal hand x 2, R forearm x 3, L dorsal hand x 2, L forearm x 5, L upper arm x 1 (18) Actinic keratoses are precancerous spots that appear secondary to cumulative UV radiation exposure/sun exposure over time. They are chronic with expected duration over 1 year. A portion of actinic keratoses will progress to squamous cell carcinoma of the skin. It is not possible to reliably predict which spots will progress to skin cancer and so treatment is recommended to prevent development of skin cancer.  Recommend daily broad spectrum sunscreen SPF 30+ to sun-exposed areas, reapply every 2 hours as needed.  Recommend staying in the shade or wearing long sleeves, sun glasses (UVA+UVB protection) and wide brim hats (4-inch brim around the entire circumference of the hat). Call for new or changing lesions.  Recheck right mid extensor forearm on follow up and plan bx if not resolved after LN2 Destruction of lesion - L mid cheek x 1, L mid helix x 1, L medial cheek x 1, R temple x 1, R nasal side wall x 1, R dorsal hand x 2, R forearm x 3, L dorsal hand x 2, L forearm x 5, L upper arm x 1 (18) Complexity: simple   Destruction method: cryotherapy   Informed consent: discussed and consent obtained   Timeout:  patient name, date of birth, surgical site, and procedure verified Lesion destroyed using liquid nitrogen: Yes   Region frozen until ice ball extended beyond lesion: Yes   Outcome: patient tolerated procedure well with no complications   Post-procedure details: wound care instructions given   LENTIGINES   SEBORRHEIC KERATOSES   ACTINIC ELASTOSIS   MULTIPLE BENIGN NEVI   CHERRY ANGIOMA   ROSACEA   ONYCHOMYCOSIS    ONYCHOMYCOSIS Exam: L first thickened toenail with subungal debris c/w onychomycosis, improving  Chronic and persistent condition with duration or expected duration over one year. Condition is symptomatic/ bothersome to patient.  Not currently at goal.  Treatment Plan: Pt treated recently by another physician.  Return in about 6 months (around 04/29/2024) for TBSE - hx BCC, SCC, AK.  I, Mara Seminole, CMA, am acting as scribe for Harris Liming, MD .    Documentation: I have reviewed the above documentation for accuracy and completeness, and I agree with the above.  Harris Liming, MD

## 2023-12-11 ENCOUNTER — Other Ambulatory Visit: Payer: Self-pay | Admitting: Family Medicine

## 2023-12-11 DIAGNOSIS — F17210 Nicotine dependence, cigarettes, uncomplicated: Secondary | ICD-10-CM

## 2023-12-11 DIAGNOSIS — F17200 Nicotine dependence, unspecified, uncomplicated: Secondary | ICD-10-CM

## 2023-12-11 DIAGNOSIS — Z136 Encounter for screening for cardiovascular disorders: Secondary | ICD-10-CM

## 2023-12-19 ENCOUNTER — Ambulatory Visit: Admission: RE | Admit: 2023-12-19 | Source: Ambulatory Visit

## 2023-12-19 ENCOUNTER — Encounter: Payer: Self-pay | Admitting: Family Medicine

## 2023-12-19 DIAGNOSIS — F17200 Nicotine dependence, unspecified, uncomplicated: Secondary | ICD-10-CM

## 2023-12-19 DIAGNOSIS — Z136 Encounter for screening for cardiovascular disorders: Secondary | ICD-10-CM

## 2023-12-20 ENCOUNTER — Encounter: Payer: Self-pay | Admitting: Family Medicine

## 2023-12-20 DIAGNOSIS — F17200 Nicotine dependence, unspecified, uncomplicated: Secondary | ICD-10-CM

## 2023-12-20 DIAGNOSIS — Z136 Encounter for screening for cardiovascular disorders: Secondary | ICD-10-CM

## 2023-12-21 ENCOUNTER — Other Ambulatory Visit: Payer: Self-pay | Admitting: Neurology

## 2023-12-21 DIAGNOSIS — G588 Other specified mononeuropathies: Secondary | ICD-10-CM

## 2023-12-24 ENCOUNTER — Other Ambulatory Visit: Payer: Self-pay | Admitting: Family Medicine

## 2023-12-24 DIAGNOSIS — F17200 Nicotine dependence, unspecified, uncomplicated: Secondary | ICD-10-CM

## 2023-12-27 ENCOUNTER — Ambulatory Visit
Admission: RE | Admit: 2023-12-27 | Discharge: 2023-12-27 | Disposition: A | Discharge status: Home / Self Care | Source: Ambulatory Visit | Attending: Neurology | Admitting: Neurology

## 2023-12-27 DIAGNOSIS — G588 Other specified mononeuropathies: Secondary | ICD-10-CM | POA: Insufficient documentation

## 2023-12-27 MED ORDER — GADOBUTROL 1 MMOL/ML IV SOLN
10.0000 mL | Freq: Once | INTRAVENOUS | Status: AC | PRN
  Administered 2023-12-27: 10 mL via INTRAVENOUS

## 2023-12-27 NOTE — Telephone Encounter (Signed)
-----   Message from ALLYSON CORDELLA STALLION, NP sent at 12/27/2023 12:19 PM EDT ----- Abnormal immune protein - we will send referral to hematology/oncology for further workup.   Viral Hepatitis B panel shows you have been exposed to Hepatitis B at some point. This can be seen in patient with resolving infection. Please follow up with primary care.   The rest of the labs are ok with minor variations.  ----- Message ----- From: Lab, Background User Sent: 12/19/2023   4:24 PM EDT To: ALLYSON CORDELLA STALLION, NP

## 2023-12-27 NOTE — Telephone Encounter (Signed)
 Left patient a message to return call regarding lab results

## 2023-12-31 ENCOUNTER — Ambulatory Visit

## 2023-12-31 ENCOUNTER — Ambulatory Visit
Admission: RE | Admit: 2023-12-31 | Discharge: 2023-12-31 | Disposition: A | Discharge status: Home / Self Care | Source: Ambulatory Visit | Attending: Family Medicine | Admitting: Family Medicine

## 2023-12-31 DIAGNOSIS — E785 Hyperlipidemia, unspecified: Secondary | ICD-10-CM | POA: Insufficient documentation

## 2023-12-31 DIAGNOSIS — F17200 Nicotine dependence, unspecified, uncomplicated: Secondary | ICD-10-CM | POA: Insufficient documentation

## 2023-12-31 DIAGNOSIS — Z205 Contact with and (suspected) exposure to viral hepatitis: Secondary | ICD-10-CM | POA: Insufficient documentation

## 2023-12-31 DIAGNOSIS — Z8673 Personal history of transient ischemic attack (TIA), and cerebral infarction without residual deficits: Secondary | ICD-10-CM | POA: Diagnosis not present

## 2023-12-31 DIAGNOSIS — E119 Type 2 diabetes mellitus without complications: Secondary | ICD-10-CM | POA: Diagnosis not present

## 2023-12-31 DIAGNOSIS — Z136 Encounter for screening for cardiovascular disorders: Secondary | ICD-10-CM | POA: Diagnosis present

## 2023-12-31 DIAGNOSIS — I10 Essential (primary) hypertension: Secondary | ICD-10-CM | POA: Diagnosis not present

## 2023-12-31 DIAGNOSIS — R55 Syncope and collapse: Secondary | ICD-10-CM | POA: Insufficient documentation

## 2023-12-31 DIAGNOSIS — F17290 Nicotine dependence, other tobacco product, uncomplicated: Secondary | ICD-10-CM | POA: Insufficient documentation

## 2024-01-17 ENCOUNTER — Inpatient Hospital Stay: Attending: Oncology | Admitting: Oncology

## 2024-01-17 ENCOUNTER — Encounter: Payer: Self-pay | Admitting: Oncology

## 2024-01-17 ENCOUNTER — Inpatient Hospital Stay

## 2024-01-17 VITALS — BP 131/74 | HR 72 | Temp 98.7°F | Resp 20 | Ht 66.0 in | Wt 300.4 lb

## 2024-01-17 DIAGNOSIS — Z79899 Other long term (current) drug therapy: Secondary | ICD-10-CM | POA: Insufficient documentation

## 2024-01-17 DIAGNOSIS — F17210 Nicotine dependence, cigarettes, uncomplicated: Secondary | ICD-10-CM | POA: Insufficient documentation

## 2024-01-17 DIAGNOSIS — Z800 Family history of malignant neoplasm of digestive organs: Secondary | ICD-10-CM | POA: Diagnosis not present

## 2024-01-17 DIAGNOSIS — D472 Monoclonal gammopathy: Secondary | ICD-10-CM | POA: Insufficient documentation

## 2024-01-17 LAB — CBC WITH DIFFERENTIAL/PLATELET
Abs Immature Granulocytes: 0.04 K/uL (ref 0.00–0.07)
Basophils Absolute: 0.1 K/uL (ref 0.0–0.1)
Basophils Relative: 1 %
Eosinophils Absolute: 0.2 K/uL (ref 0.0–0.5)
Eosinophils Relative: 3 %
HCT: 39.3 % (ref 39.0–52.0)
Hemoglobin: 13.4 g/dL (ref 13.0–17.0)
Immature Granulocytes: 1 %
Lymphocytes Relative: 34 %
Lymphs Abs: 2.4 K/uL (ref 0.7–4.0)
MCH: 32 pg (ref 26.0–34.0)
MCHC: 34.1 g/dL (ref 30.0–36.0)
MCV: 93.8 fL (ref 80.0–100.0)
Monocytes Absolute: 0.6 K/uL (ref 0.1–1.0)
Monocytes Relative: 8 %
Neutro Abs: 3.9 K/uL (ref 1.7–7.7)
Neutrophils Relative %: 53 %
Platelets: 232 K/uL (ref 150–400)
RBC: 4.19 MIL/uL — ABNORMAL LOW (ref 4.22–5.81)
RDW: 12.4 % (ref 11.5–15.5)
WBC: 7.2 K/uL (ref 4.0–10.5)
nRBC: 0 % (ref 0.0–0.2)

## 2024-01-17 LAB — BASIC METABOLIC PANEL WITH GFR
Anion gap: 9 (ref 5–15)
BUN: 20 mg/dL (ref 8–23)
CO2: 26 mmol/L (ref 22–32)
Calcium: 10 mg/dL (ref 8.9–10.3)
Chloride: 104 mmol/L (ref 98–111)
Creatinine, Ser: 1.05 mg/dL (ref 0.61–1.24)
GFR, Estimated: >60 mL/min (ref >60)
Glucose, Bld: 105 mg/dL — ABNORMAL HIGH (ref 70–99)
Potassium: 3.9 mmol/L (ref 3.5–5.1)
Sodium: 139 mmol/L (ref 135–145)

## 2024-01-17 NOTE — Progress Notes (Signed)
 [facility]  Telephone:(336) [phone] Fax:(336) [phone]  ID: Jerry Huynh Chol OB: 09-29-1950  MR#: [id number]  RDW#:[id number]  Patient Care Team: Valora Agent, MD as PCP - General (Family Medicine)  CHIEF COMPLAINT: MGUS.  INTERVAL HISTORY: Patient is a 73 year old male who was noted to have a mildly elevated M spike on routine blood work.  He is anxious, but otherwise feels well.  He has no neurologic complaints.  He denies any recent fevers or illnesses.  He has a good appetite and denies weight loss.  He has no chest pain, shortness of breath, cough, or hemoptysis.  He denies any nausea, vomiting, constipation, or diarrhea.  He has no urinary complaints.  Patient feels at his baseline and offers no further specific complaints today.  REVIEW OF SYSTEMS:   Review of Systems  Constitutional: Negative.  Negative for fever, malaise/fatigue and weight loss.  Respiratory: Negative.  Negative for cough, hemoptysis and shortness of breath.   Cardiovascular: Negative.  Negative for chest pain and leg swelling.  Gastrointestinal: Negative.  Negative for abdominal pain.  Genitourinary: Negative.  Negative for dysuria.  Musculoskeletal: Negative.  Negative for back pain.  Skin: Negative.  Negative for rash.  Neurological: Negative.  Negative for dizziness, focal weakness, weakness and headaches.  Psychiatric/Behavioral:  The patient is nervous/anxious.     As per HPI. Otherwise, a complete review of systems is negative.  PAST MEDICAL HISTORY: Past Medical History:  Diagnosis Date   Basal cell carcinoma 03/26/2014   Left upper back post. shoulder inf. Superficial.   Depression    Diabetes mellitus without complication (HCC)    Hyperlipidemia    Hypertension    SCC (squamous cell carcinoma) 04/30/2023   SCC IS Right Dorsal Hand, clear s/p bx 08/13/23   SCC (squamous cell carcinoma) 04/30/2023   Left Dorsal Proximal Forearm, clear s/p bx 08/13/23   Squamous cell carcinoma  in situ (SCCIS) 04/30/2023   right dorsal hand   Squamous cell carcinoma in situ (SCCIS) 04/30/2023   left dorsal proximal forearm   Squamous cell carcinoma of skin 03/26/2014   Left upper back post. shoulder sup. SCCis arising in LK.    PAST SURGICAL HISTORY: Past Surgical History:  Procedure Laterality Date   APPENDECTOMY     COLONOSCOPY     COLONOSCOPY WITH PROPOFOL  N/A 11/10/2016   Procedure: COLONOSCOPY WITH PROPOFOL ;  Surgeon: Viktoria Lamar DASEN, MD;  Location: [facility] ENDOSCOPY;  Service: Endoscopy;  Laterality: N/A;   COLONOSCOPY WITH PROPOFOL  N/A 07/07/2020   Procedure: COLONOSCOPY WITH PROPOFOL ;  Surgeon: Toledo, Ladell POUR, MD;  Location: [facility] ENDOSCOPY;  Service: Gastroenterology;  Laterality: N/A;  DM   ESOPHAGOGASTRODUODENOSCOPY (EGD) WITH PROPOFOL  N/A 01/09/2022   Procedure: ESOPHAGOGASTRODUODENOSCOPY (EGD) WITH PROPOFOL ;  Surgeon: Maryruth Ole DASEN, MD;  Location: [facility] ENDOSCOPY;  Service: Endoscopy;  Laterality: N/A;   HERNIA REPAIR      FAMILY HISTORY: Family History  Problem Relation Age of Onset   Aneurysm Mother    Colon cancer Father    Healthy Sister    Healthy Brother    Healthy Brother    Alzheimer's disease Maternal Grandmother    Aneurysm Paternal Grandmother    Kidney failure Paternal Grandfather     ADVANCED DIRECTIVES (Y/N):  N  HEALTH MAINTENANCE: Social History   Tobacco Use   Smoking status: Every Day    Current packs/day: 0.50    Types: Cigarettes   Smokeless tobacco: Never  Vaping Use   Vaping status: Never Used  Substance Use  Topics   Alcohol use: Not Currently    Comment: occassional   Drug use: No     Colonoscopy:  PAP:  Bone density:  Lipid panel:  Allergies  Allergen Reactions   Sulfa Antibiotics Anaphylaxis   Codeine Nausea And Vomiting    Current Outpatient Medications  Medication Sig Dispense Refill   amLODipine (NORVASC) 5 MG tablet Take 5 mg by mouth.     cetirizine (ZYRTEC) 10 MG tablet Take 10 mg by mouth  daily.     cyclobenzaprine (FLEXERIL) 10 MG tablet cyclobenzaprine 10 mg tablet  TAKE 1 TABLET BY MOUTH THREE TIMES DAILY     escitalopram (LEXAPRO) 10 MG tablet Take 10 mg by mouth daily.     fluticasone (FLONASE) 50 MCG/ACT nasal spray Place 2 sprays into the nose.     gabapentin (NEURONTIN) 100 MG capsule Take 100 mg by mouth 3 (three) times daily.     glipiZIDE (GLUCOTROL XL) 5 MG 24 hr tablet Take by mouth.     lisinopril-hydrochlorothiazide (PRINZIDE,ZESTORETIC) 20-25 MG tablet Take 1 tablet by mouth daily.     meloxicam (MOBIC) 15 MG tablet Take 1 tablet by mouth daily.     metoprolol succinate (TOPROL-XL) 25 MG 24 hr tablet Take 25 mg by mouth daily.     Multiple Vitamin (MULTIVITAMIN) tablet Take 1 tablet by mouth daily.     Omega-3 1000 MG CAPS Take 1 capsule by mouth daily.     omeprazole (PRILOSEC) 20 MG capsule Take 20 mg by mouth daily.     pioglitazone (ACTOS) 45 MG tablet Take 45 mg by mouth daily.     rosuvastatin (CRESTOR) 5 MG tablet Take 5 mg by mouth.     terbinafine  (LAMISIL ) 250 MG tablet Take 1 tablet (250 mg total) by mouth daily. 90 tablet 0   Na Sulfate-K Sulfate-Mg Sulfate concentrate (SUPREP) 17.5-3.13-1.6 GM/177ML SOLN See admin instructions. (Patient not taking: Reported on 01/17/2024)     No current facility-administered medications for this visit.    OBJECTIVE: Vitals:   01/17/24 1521  BP: 131/74  Pulse: 72  Resp: 20  Temp: 98.7 F (37.1 C)  SpO2: 97%     Body mass index is 48.49 kg/m.    ECOG FS:0 - Asymptomatic  General: Well-developed, well-nourished, no acute distress. Eyes: Pink conjunctiva, anicteric sclera. HEENT: Normocephalic, moist mucous membranes. Lungs: No audible wheezing or coughing. Heart: Regular rate and rhythm. Abdomen: Soft, nontender, no obvious distention. Musculoskeletal: No edema, cyanosis, or clubbing. Neuro: Alert, answering all questions appropriately. Cranial nerves grossly intact. Skin: No rashes or petechiae  noted. Psych: Normal affect. Lymphatics: No cervical, calvicular, axillary or inguinal LAD.   LAB RESULTS:  Lab Results  Component Value Date   NA 133 (L) 07/16/2021   K 4.5 07/16/2021   CL 96 (L) 07/16/2021   CO2 26 07/16/2021   GLUCOSE 258 (H) 07/16/2021   BUN 24 (H) 07/16/2021   CREATININE 1.07 07/16/2021   CALCIUM 9.4 07/16/2021   PROT 7.9 07/16/2021   ALBUMIN 4.4 07/16/2021   AST 25 07/16/2021   ALT 30 07/16/2021   ALKPHOS 76 07/16/2021   BILITOT 0.5 07/16/2021   GFRNONAA >60 07/16/2021    Lab Results  Component Value Date   WBC 8.4 07/16/2021   NEUTROABS 6.2 09/20/2020   HGB 15.5 07/16/2021   HCT 44.5 07/16/2021   MCV 91.0 07/16/2021   PLT 243 07/16/2021     STUDIES: MR THORACIC SPINE W WO CONTRAST Result Date: 01/01/2024 CLINICAL DATA:  Chronic mid back pain EXAM: MRI THORACIC WITHOUT AND WITH CONTRAST TECHNIQUE: Multiplanar and multiecho pulse sequences of the thoracic spine were obtained without and with intravenous contrast. CONTRAST:  10mL GADAVIST  GADOBUTROL  1 MMOL/ML IV SOLN COMPARISON:  MRI of the thoracic spine dated 09/28/2021 FINDINGS: Alignment:  Physiologic. Vertebrae: No acute fracture, evidence of discitis, or bone lesion. Mild chronic wedge deformities at T6, T7, and T8. Degenerative endplate marrow changes at a few levels. Areas of T1/T2 hyperintensity in lumbar vertebrae most consistent with intraosseous hemangioma or focal fat. Cord:  Normal signal and morphology. Paraspinal and other soft tissues: Negative. Disc levels: No high-grade canal or foraminal stenosis. Mild multilevel disc desiccation and bulging. IMPRESSION: 1. No acute fracture. Mild chronic wedge deformities at T6, T7, and T8. 2. Mild multilevel disc degeneration. No high-grade canal or foraminal stenosis. Electronically Signed   By: Clem Savory M.D.   On: 01/01/2024 12:10   US  AORTA DUPLEX LIMITED Result Date: 12/31/2023 CLINICAL DATA:  Abdominal aortic aneurysm screening.  Hypertension. TIA. Syncope. Hyperlipidemia. Diabetes. Tobacco use. EXAM: ULTRASOUND OF ABDOMINAL AORTA TECHNIQUE: Ultrasound examination of the abdominal aorta and proximal common iliac arteries was performed to evaluate for aneurysm. Additional color and Doppler images of the distal aorta were obtained to document patency. COMPARISON:  CT angiography chest abdomen pelvis 07/16/2021 FINDINGS: Abdominal aortic measurements as follows: Proximal:  2.8 x 2.9 cm Mid:  2.0 x 2.2 cm Distal:  2.0 x 2.1 cm Patent: Yes, peak systolic velocity is 96 cm/s Right common iliac artery: 1.3 x 1.7 cm Left common iliac artery: 1.3 x 1.7 cm Diffuse increased echogenicity of the visualized portions of the hepatic parenchyma are a nonspecific indicator of hepatocellular dysfunction, most commonly steatosis. IMPRESSION: No abdominal aortic aneurysm. Electronically Signed   By: Aliene Lloyd M.D.   On: 12/31/2023 12:36    ASSESSMENT: MGUS.  PLAN:    MGUS: Patient noted to have a mildly increased M spike of 0.3 on recent laboratory work.  He has no evidence of endorgan damage.  Repeat SPEP as well as kappa/lambda free light chains and immunoglobulins are pending at time of dictation.  No intervention is needed at this time.  Patient does not require imaging or bone marrow biopsy.  Return to clinic in 3 months with repeat laboratory work and further evaluation.  I spent a total of 45 minutes reviewing chart data, face-to-face evaluation with the patient, counseling and coordination of care as detailed above.   Patient expressed understanding and was in agreement with this plan. He also understands that He can call clinic at any time with any questions, concerns, or complaints.    Evalene JINNY Reusing, MD   01/17/2024 4:14 PM

## 2024-01-19 LAB — IGG, IGA, IGM
IgA: 302 mg/dL (ref 61–437)
IgG (Immunoglobin G), Serum: 839 mg/dL (ref 603–1613)
IgM (Immunoglobulin M), Srm: 58 mg/dL (ref 15–143)

## 2024-01-22 LAB — KAPPA/LAMBDA LIGHT CHAINS
Kappa free light chain: 32.2 mg/L — ABNORMAL HIGH (ref 3.3–19.4)
Kappa, lambda light chain ratio: 1.35 (ref 0.26–1.65)
Lambda free light chains: 23.8 mg/L (ref 5.7–26.3)

## 2024-01-23 LAB — PROTEIN ELECTROPHORESIS, SERUM
A/G Ratio: 1.2 (ref 0.7–1.7)
Albumin ELP: 3.7 g/dL (ref 2.9–4.4)
Alpha-1-Globulin: 0.3 g/dL (ref 0.0–0.4)
Alpha-2-Globulin: 0.8 g/dL (ref 0.4–1.0)
Beta Globulin: 1.3 g/dL (ref 0.7–1.3)
Gamma Globulin: 0.7 g/dL (ref 0.4–1.8)
Globulin, Total: 3 g/dL (ref 2.2–3.9)
M-Spike, %: 0.3 g/dL — ABNORMAL HIGH
Total Protein ELP: 6.7 g/dL (ref 6.0–8.5)

## 2024-02-14 ENCOUNTER — Ambulatory Visit: Admitting: Dermatology

## 2024-04-11 ENCOUNTER — Ambulatory Visit: Admitting: Podiatry

## 2024-04-11 ENCOUNTER — Encounter: Payer: Self-pay | Admitting: Podiatry

## 2024-04-11 VITALS — Ht 66.0 in | Wt 300.0 lb

## 2024-04-11 DIAGNOSIS — D2372 Other benign neoplasm of skin of left lower limb, including hip: Secondary | ICD-10-CM | POA: Diagnosis not present

## 2024-04-11 DIAGNOSIS — B351 Tinea unguium: Secondary | ICD-10-CM | POA: Diagnosis not present

## 2024-04-11 DIAGNOSIS — M79675 Pain in left toe(s): Secondary | ICD-10-CM

## 2024-04-11 NOTE — Progress Notes (Signed)
   Chief Complaint  Patient presents with   Nail Problem    DFC.    SUBJECTIVE Patient presents to office today complaining of elongated, thickened nails that cause pain while ambulating in shoes.  Patient is unable to trim their own nails. Patient is here for further evaluation and treatment.  Past Medical History:  Diagnosis Date   Basal cell carcinoma 03/26/2014   Left upper back post. shoulder inf. Superficial.   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 shoulder sup. SCCis arising in LK.    Allergies  Allergen Reactions   Sulfa Antibiotics Anaphylaxis   Codeine Nausea And Vomiting     OBJECTIVE General Patient is awake, alert, and oriented x 3 and in no acute distress. Derm Skin is dry and supple bilateral. Negative open lesions or macerations. Remaining integument unremarkable. Nails are tender, long, thickened and dystrophic with subungual debris, consistent with onychomycosis, 1-5 bilateral. No signs of infection noted. Vasc  DP and PT pedal pulses palpable bilaterally. Temperature gradient within normal limits.  Neuro Epicritic and protective threshold sensation grossly intact bilaterally.  Musculoskeletal Exam No symptomatic pedal deformities noted bilateral. Muscular strength within normal limits.  ASSESSMENT 1.  Pain due to onychomycosis of toenails both Feet.  Eccrine poroma bilateral great toes  PLAN OF CARE -Patient evaluated today.  -Instructed to maintain good pedal hygiene and foot care.   -Mechanical debridement of nails 1-5 bilaterally performed using a nail nipper. Filed with dremel without incident.  -Excisional debridement of the hyperkeratotic skin lesions to the bilateral great toes was performed today using a tissue nipper.  Salicylic acid and a light dressing applied -Return to clinic PRN   Thresa EMERSON Sar, DPM [facility]  Dr. Thresa EMERSON Sar, DPM    [address] [place], [place] [address]                Office [phone]  Fax [phone]

## 2024-04-14 ENCOUNTER — Encounter: Payer: Self-pay | Admitting: *Deleted

## 2024-04-21 ENCOUNTER — Inpatient Hospital Stay: Attending: Oncology

## 2024-04-21 DIAGNOSIS — D472 Monoclonal gammopathy: Secondary | ICD-10-CM | POA: Diagnosis present

## 2024-04-21 DIAGNOSIS — F17210 Nicotine dependence, cigarettes, uncomplicated: Secondary | ICD-10-CM | POA: Insufficient documentation

## 2024-04-21 DIAGNOSIS — Z79899 Other long term (current) drug therapy: Secondary | ICD-10-CM | POA: Diagnosis not present

## 2024-04-21 LAB — BASIC METABOLIC PANEL WITH GFR
Anion gap: 10 (ref 5–15)
BUN: 24 mg/dL — ABNORMAL HIGH (ref 8–23)
CO2: 25 mmol/L (ref 22–32)
Calcium: 9.7 mg/dL (ref 8.9–10.3)
Chloride: 102 mmol/L (ref 98–111)
Creatinine, Ser: 1.2 mg/dL (ref 0.61–1.24)
GFR, Estimated: >60 mL/min (ref >60)
Glucose, Bld: 131 mg/dL — ABNORMAL HIGH (ref 70–99)
Potassium: 4.8 mmol/L (ref 3.5–5.1)
Sodium: 137 mmol/L (ref 135–145)

## 2024-04-21 LAB — CBC WITH DIFFERENTIAL/PLATELET
Abs Immature Granulocytes: 0.04 K/uL (ref 0.00–0.07)
Basophils Absolute: 0.1 K/uL (ref 0.0–0.1)
Basophils Relative: 1 %
Eosinophils Absolute: 0.1 K/uL (ref 0.0–0.5)
Eosinophils Relative: 2 %
HCT: 41.3 % (ref 39.0–52.0)
Hemoglobin: 14 g/dL (ref 13.0–17.0)
Immature Granulocytes: 1 %
Lymphocytes Relative: 29 %
Lymphs Abs: 1.6 K/uL (ref 0.7–4.0)
MCH: 31.8 pg (ref 26.0–34.0)
MCHC: 33.9 g/dL (ref 30.0–36.0)
MCV: 93.9 fL (ref 80.0–100.0)
Monocytes Absolute: 0.5 K/uL (ref 0.1–1.0)
Monocytes Relative: 8 %
Neutro Abs: 3.3 K/uL (ref 1.7–7.7)
Neutrophils Relative %: 59 %
Platelets: 197 K/uL (ref 150–400)
RBC: 4.4 MIL/uL (ref 4.22–5.81)
RDW: 12.2 % (ref 11.5–15.5)
WBC: 5.6 K/uL (ref 4.0–10.5)
nRBC: 0 % (ref 0.0–0.2)

## 2024-04-22 LAB — PROTEIN ELECTROPHORESIS, SERUM
A/G Ratio: 1.2 (ref 0.7–1.7)
Albumin ELP: 3.7 g/dL (ref 2.9–4.4)
Alpha-1-Globulin: 0.2 g/dL (ref 0.0–0.4)
Alpha-2-Globulin: 0.8 g/dL (ref 0.4–1.0)
Beta Globulin: 1.2 g/dL (ref 0.7–1.3)
Gamma Globulin: 0.8 g/dL (ref 0.4–1.8)
Globulin, Total: 3 g/dL (ref 2.2–3.9)
M-Spike, %: 0.3 g/dL — ABNORMAL HIGH
Total Protein ELP: 6.7 g/dL (ref 6.0–8.5)

## 2024-04-22 LAB — IGG, IGA, IGM
IgA: 320 mg/dL (ref 61–437)
IgG (Immunoglobin G), Serum: 868 mg/dL (ref 603–1613)
IgM (Immunoglobulin M), Srm: 50 mg/dL (ref 15–143)

## 2024-04-22 LAB — KAPPA/LAMBDA LIGHT CHAINS
Kappa free light chain: 31.3 mg/L — ABNORMAL HIGH (ref 3.3–19.4)
Kappa, lambda light chain ratio: 1.34 (ref 0.26–1.65)
Lambda free light chains: 23.3 mg/L (ref 5.7–26.3)

## 2024-04-25 ENCOUNTER — Ambulatory Visit: Admitting: Certified Registered"

## 2024-04-25 ENCOUNTER — Encounter: Admission: RE | Disposition: A | Payer: Self-pay | Source: Home / Self Care | Attending: Gastroenterology

## 2024-04-25 ENCOUNTER — Ambulatory Visit
Admission: RE | Admit: 2024-04-25 | Discharge: 2024-04-25 | Disposition: A | Attending: Gastroenterology | Admitting: Gastroenterology

## 2024-04-25 HISTORY — PX: COLONOSCOPY: SHX5424

## 2024-04-25 HISTORY — DX: Sleep apnea, unspecified: G47.30

## 2024-04-25 HISTORY — DX: Dyspnea, unspecified: R06.00

## 2024-04-25 HISTORY — PX: POLYPECTOMY: SHX149

## 2024-04-25 LAB — GLUCOSE, CAPILLARY: Glucose-Capillary: 142 mg/dL — ABNORMAL HIGH (ref 70–99)

## 2024-04-25 SURGERY — COLONOSCOPY
Anesthesia: General

## 2024-04-25 MED ORDER — LIDOCAINE HCL (PF) 2 % IJ SOLN
INTRAMUSCULAR | Status: AC
  Filled 2024-04-25: qty 5

## 2024-04-25 MED ORDER — SODIUM CHLORIDE 0.9 % IV SOLN: INTRAVENOUS | Status: DC

## 2024-04-25 MED ORDER — LIDOCAINE HCL (CARDIAC) PF 100 MG/5ML IV SOSY
PREFILLED_SYRINGE | INTRAVENOUS | Status: DC | PRN
  Administered 2024-04-25: 100 mg via INTRAVENOUS

## 2024-04-25 MED ORDER — PROPOFOL 500 MG/50ML IV EMUL
INTRAVENOUS | Status: DC | PRN
  Administered 2024-04-25: 80 ug/kg/min via INTRAVENOUS
  Administered 2024-04-25: 90 mg via INTRAVENOUS

## 2024-04-25 MED ORDER — PROPOFOL 10 MG/ML IV BOLUS
INTRAVENOUS | Status: AC
  Filled 2024-04-25: qty 40

## 2024-04-25 NOTE — Transfer of Care (Signed)
 Immediate Anesthesia Transfer of Care Note  Patient: Jerry Huynh  Procedure(s) Performed: COLONOSCOPY  Patient Location: Endoscopy Unit  Anesthesia Type:General  Level of Consciousness: awake, alert , and oriented  Airway & Oxygen Therapy: Patient Spontanous Breathing  Post-op Assessment: Report given to RN and Post -op Vital signs reviewed and stable  Post vital signs: Reviewed and stable  Last Vitals:  Vitals Value Taken Time  BP 91/59 04/25/24 09:54  Temp 36 C 04/25/24 09:53  Pulse 78 04/25/24 09:54  Resp 15 04/25/24 09:54  SpO2 96 % 04/25/24 09:54  Vitals shown include unfiled device data.  Last Pain:  Vitals:   04/25/24 0953  TempSrc: Temporal  PainSc: 0-No pain         Complications: No notable events documented.

## 2024-04-25 NOTE — H&P (Signed)
 Outpatient short stay form Pre-procedure 04/25/2024  Jerry Huynh Schick, MD  Primary Physician: Valora Lynwood FALCON, MD  Reason for visit:  Surveillance colonoscopy  History of present illness:    73 y/o gentleman with history of obesity, OSA, HLD, and hypertension here for surveillance colonoscopy. Last colonoscopy in 2022 with several small Ta's. History of inguinal hernia repairs and appendectomy. No blood thinners. Father had colon cancer in his 72's.    Current Facility-Administered Medications:    0.9 %  sodium chloride  infusion, , Intravenous, Continuous, Badr Piedra, Jerry ONEIDA, MD, Last Rate: 20 mL/hr at 04/25/24 0904, Continued from Pre-op at 04/25/24 0904  Medications Prior to Admission  Medication Sig Dispense Refill Last Dose/Taking   cetirizine (ZYRTEC) 10 MG tablet Take 10 mg by mouth daily.   04/25/2024 Morning   gabapentin (NEURONTIN) 100 MG capsule Take 100 mg by mouth 3 (three) times daily.   04/25/2024 Morning   glipiZIDE (GLUCOTROL XL) 5 MG 24 hr tablet Take by mouth.   04/24/2024   lisinopril-hydrochlorothiazide (PRINZIDE,ZESTORETIC) 20-25 MG tablet Take 1 tablet by mouth daily.   Past Week   metoprolol succinate (TOPROL-XL) 25 MG 24 hr tablet Take 25 mg by mouth daily.   Past Week   omeprazole (PRILOSEC) 20 MG capsule Take 20 mg by mouth daily.   Past Week   rosuvastatin (CRESTOR) 5 MG tablet Take 5 mg by mouth.   04/24/2024 Morning   cyclobenzaprine (FLEXERIL) 10 MG tablet cyclobenzaprine 10 mg tablet  TAKE 1 TABLET BY MOUTH THREE TIMES DAILY      escitalopram (LEXAPRO) 10 MG tablet Take 10 mg by mouth daily.      fluticasone (FLONASE) 50 MCG/ACT nasal spray Place 2 sprays into the nose.      meloxicam (MOBIC) 15 MG tablet Take 1 tablet by mouth daily.      Multiple Vitamin (MULTIVITAMIN) tablet Take 1 tablet by mouth daily.      Na Sulfate-K Sulfate-Mg Sulfate concentrate (SUPREP) 17.5-3.13-1.6 GM/177ML SOLN See admin instructions.      Omega-3 1000 MG CAPS Take 1  capsule by mouth daily.      pioglitazone (ACTOS) 45 MG tablet Take 45 mg by mouth daily.      terbinafine  (LAMISIL ) 250 MG tablet Take 1 tablet (250 mg total) by mouth daily. 90 tablet 0      Allergies  Allergen Reactions   Sulfa Antibiotics Anaphylaxis   Codeine Nausea And Vomiting     Past Medical History:  Diagnosis Date   Basal cell carcinoma 03/26/2014   Left upper back post. shoulder inf. Superficial.   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 shoulder sup. SCCis arising in LK.    Review of systems:  Otherwise negative.    Physical Exam  Gen: Alert, oriented. Appears stated age.  HEENT: PERRLA. Lungs: No respiratory distress CV: RRR Abd: soft, benign, no masses Ext: No edema    Planned procedures: Proceed  with colonoscopy. The patient understands the nature of the planned procedure, indications, risks, alternatives and potential complications including but not limited to bleeding, infection, perforation, damage to internal organs and possible oversedation/side effects from anesthesia. The patient agrees and gives consent to proceed.  Please refer to procedure notes for findings, recommendations and patient disposition/instructions.     Jerry Huynh Schick, MD [facility] Gastroenterology

## 2024-04-25 NOTE — Interval H&P Note (Signed)
 History and Physical Interval Note:  04/25/2024 9:20 AM  Jerry Huynh  has presented today for surgery, with the diagnosis of Hx of adenomatous colonic polyps [Z86.0101].  The various methods of treatment have been discussed with the patient and family. After consideration of risks, benefits and other options for treatment, the patient has consented to  Procedure(s) with comments: COLONOSCOPY (N/A) - DM as a surgical intervention.  The patient's history has been reviewed, patient examined, no change in status, stable for surgery.  I have reviewed the patient's chart and labs.  Questions were answered to the patient's satisfaction.     Jerry Huynh  Ok to proceed with colonoscopy

## 2024-04-25 NOTE — Op Note (Signed)
 [facility] Gastroenterology Patient Name: Jerry Huynh Procedure Date: 04/25/2024 9:05 AM MRN: [id number] Account #: [id number] Date of Birth: 11/11/1950 Admit Type: Outpatient Age: 73 Room: [facility] ENDO ROOM 3 Gender: Male Note Status: Finalized Instrument Name: Colon Scope [id number] Procedure:             Colonoscopy Indications:           Surveillance: Personal history of adenomatous polyps                         on last colonoscopy 3 years ago Providers:             Ole Schick MD, MD Referring MD:          Lynwood FALCON. Valora, MD (Referring MD) Medicines:             Monitored Anesthesia Care Complications:         No immediate complications. Procedure:             Pre-Anesthesia Assessment:                        - Prior to the procedure, a History and Physical was                         performed, and patient medications and allergies were                         reviewed. The patient is competent. The risks and                         benefits of the procedure and the sedation options and                         risks were discussed with the patient. All questions                         were answered and informed consent was obtained.                         Patient identification and proposed procedure were                         verified by the physician, the nurse, the                         anesthesiologist, the anesthetist and the technician                         in the endoscopy suite. Mental Status Examination:                         alert and oriented. Airway Examination: normal                         oropharyngeal airway and neck mobility. Respiratory                         Examination: clear to auscultation. CV Examination:  normal. Prophylactic Antibiotics: The patient does not                         require prophylactic antibiotics. Prior                         Anticoagulants: The patient has taken no  anticoagulant                         or antiplatelet agents. ASA Grade Assessment: III - A                         patient with severe systemic disease. After reviewing                         the risks and benefits, the patient was deemed in                         satisfactory condition to undergo the procedure. The                         anesthesia plan was to use monitored anesthesia care                         (MAC). Immediately prior to administration of                         medications, the patient was re-assessed for adequacy                         to receive sedatives. The heart rate, respiratory                         rate, oxygen saturations, blood pressure, adequacy of                         pulmonary ventilation, and response to care were                         monitored throughout the procedure. The physical                         status of the patient was re-assessed after the                         procedure.                        After obtaining informed consent, the colonoscope was                         passed under direct vision. Throughout the procedure,                         the patient's blood pressure, pulse, and oxygen                         saturations were monitored continuously. The was  introduced through the anus and advanced to the the                         cecum, identified by appendiceal orifice and ileocecal                         valve. The colonoscopy was somewhat difficult due to a                         redundant colon. Successful completion of the                         procedure was aided by applying abdominal pressure.                         The patient tolerated the procedure well. The quality                         of the bowel preparation was adequate to identify                         polyps. The ileocecal valve, appendiceal orifice, and                         rectum were photographed. Findings:       The perianal and digital rectal examinations were normal.      A 5 mm polyp was found in the cecum. The polyp was sessile. The polyp       was removed with a cold snare. Resection was complete, but the polyp       tissue was not retrieved. Estimated blood loss was minimal.      Five sessile polyps were found in the transverse colon. The polyps were       2 to 4 mm in size. These polyps were removed with a cold snare.       Resection and retrieval were complete. Estimated blood loss was minimal.      A few small-mouthed diverticula were found in the sigmoid colon.      Internal hemorrhoids were found during retroflexion. The hemorrhoids       were Grade I (internal hemorrhoids that do not prolapse).      The exam was otherwise without abnormality on direct and retroflexion       views. Impression:            - One 5 mm polyp in the cecum, removed with a cold                         snare. Complete resection. Polyp tissue not retrieved.                        - Five 2 to 4 mm polyps in the transverse colon,                         removed with a cold snare. Resected and retrieved.                        - Diverticulosis in the sigmoid colon.                        -  Internal hemorrhoids.                        - The examination was otherwise normal on direct and                         retroflexion views. Recommendation:        - Discharge patient to home.                        - Resume previous diet.                        - Continue present medications.                        - Await pathology results.                        - Repeat colonoscopy in 3 years for surveillance.                        - Return to referring physician as previously                         scheduled. Procedure Code(s):     --- Professional ---                        [phone], Colonoscopy, flexible; with removal of                         tumor(s), polyp(s), or other lesion(s) by snare                          technique Diagnosis Code(s):     --- Professional ---                        Z86.010, Personal history of colonic polyps                        D12.0, Benign neoplasm of cecum                        D12.3, Benign neoplasm of transverse colon (hepatic                         flexure or splenic flexure)                        K64.0, First degree hemorrhoids                        K57.30, Diverticulosis of large intestine without                         perforation or abscess without bleeding CPT copyright 2022 American Medical Association. All rights reserved. The codes documented in this report are preliminary and upon coder review may  be revised to meet current compliance requirements. Ole Schick MD, MD 04/25/2024 9:56:43 AM Number of Addenda: 0 Note Initiated On: 04/25/2024 9:05 AM Scope Withdrawal Time: 0 hours 11 minutes 52 seconds  Total Procedure Duration: 0 hours 17 minutes 56 seconds  Estimated Blood Loss:  Estimated blood loss was minimal.      [facility]

## 2024-04-25 NOTE — Anesthesia Preprocedure Evaluation (Signed)
 Anesthesia Evaluation  Patient identified by MRN, date of birth, ID band Patient awake    Reviewed: Allergy & Precautions, NPO status , Patient's Chart, lab work & pertinent test results  History of Anesthesia Complications Negative for: history of anesthetic complications  Airway Mallampati: III  TM Distance: >3 FB Neck ROM: Full    Dental  (+) Poor Dentition, Dental Advidsory Given   Pulmonary shortness of breath and with exertion, neg sleep apnea, neg COPD, neg recent URI, Current Smoker and Patient abstained from smoking.   Pulmonary exam normal breath sounds clear to auscultation       Cardiovascular Exercise Tolerance: Poor METShypertension, Pt. on medications (-) angina (-) CAD, (-) Past MI and (-) Cardiac Stents (-) dysrhythmias + Valvular Problems/Murmurs  Rhythm:Regular Rate:Normal - Systolic murmurs TTE: INTERPRETATION  NORMAL LEFT VENTRICULAR SYSTOLIC FUNCTION  WITH MILD LVH  NORMAL RIGHT VENTRICULAR SYSTOLIC FUNCTION  TRIVIAL REGURGITATION NOTED (See above)  NO VALVULAR STENOSIS  SCLEROTIC AoV  TRIVIAL MR, TR  EF >55%  Morphology: MILDLY THICKENED  Closest EF: >55% (Estimated)  LVH: MILD LVH  Mitral: TRIVIAL MR  Tricuspid: TRIVIAL TR    Neuro/Psych  PSYCHIATRIC DISORDERS  Depression    negative neurological ROS     GI/Hepatic ,neg GERD  ,,(+)     (-) substance abuse    Endo/Other  diabetes, Oral Hypoglycemic Agents  Class 3 obesity  Renal/GU negative Renal ROS     Musculoskeletal   Abdominal  (+) + obese  Peds  Hematology   Anesthesia Other Findings Past Medical History: 03/26/2014: Basal cell carcinoma     Comment:  Left upper back post. shoulder inf. Superficial. No date: Depression No date: Diabetes mellitus without complication (HCC) No date: Hyperlipidemia No date: Hypertension 03/26/2014: Squamous cell carcinoma of skin     Comment:  Left upper back post. shoulder sup. SCCis  arising in LK.  Reproductive/Obstetrics                              Anesthesia Physical Anesthesia Plan  ASA: 3  Anesthesia Plan: General   Post-op Pain Management: Minimal or no pain anticipated   Induction: Intravenous  PONV Risk Score and Plan: 2 and Propofol  infusion and TIVA  Airway Management Planned: Nasal Cannula and Natural Airway  Additional Equipment: None  Intra-op Plan:   Post-operative Plan:   Informed Consent: I have reviewed the patients History and Physical, chart, labs and discussed the procedure including the risks, benefits and alternatives for the proposed anesthesia with the patient or authorized representative who has indicated his/her understanding and acceptance.     Dental advisory given  Plan Discussed with: CRNA and Surgeon  Anesthesia Plan Comments: (Discussed risks of anesthesia with patient, including possibility of difficulty with spontaneous ventilation under anesthesia necessitating airway intervention, PONV, and rare risks such as cardiac or respiratory or neurological events. Patient understands.  During last colonoscopy patient says he had to stay in recovery area for 4 hours due to hypotension. He says he did not take his blood pressure meds this morning.)         Anesthesia Quick Evaluation

## 2024-04-25 NOTE — Anesthesia Postprocedure Evaluation (Signed)
 Anesthesia Post Note  Patient: Jerry Huynh  Procedure(s) Performed: COLONOSCOPY POLYPECTOMY, INTESTINE  Patient location during evaluation: Endoscopy Anesthesia Type: General Level of consciousness: awake and alert Pain management: pain level controlled Vital Signs Assessment: post-procedure vital signs reviewed and stable Respiratory status: spontaneous breathing, nonlabored ventilation, respiratory function stable and patient connected to nasal cannula oxygen Cardiovascular status: blood pressure returned to baseline and stable Postop Assessment: no apparent nausea or vomiting Anesthetic complications: no   No notable events documented.   Last Vitals:  Vitals:   04/25/24 0953 04/25/24 1003  BP: (!) 81/56 130/67  Pulse: 74 70  Resp: (!) 24 12  Temp: (!) 36 C   SpO2: 97% 95%    Last Pain:  Vitals:   04/25/24 1003  TempSrc:   PainSc: (P) 0-No pain                 Prentice Murphy

## 2024-04-28 ENCOUNTER — Ambulatory Visit: Admitting: Dermatology

## 2024-04-28 LAB — SURGICAL PATHOLOGY

## 2024-04-29 ENCOUNTER — Inpatient Hospital Stay: Admitting: Oncology

## 2024-04-29 ENCOUNTER — Encounter: Payer: Self-pay | Admitting: Oncology

## 2024-04-29 VITALS — BP 124/88 | HR 64 | Temp 97.2°F | Resp 16 | Wt 307.0 lb

## 2024-04-29 DIAGNOSIS — D472 Monoclonal gammopathy: Secondary | ICD-10-CM

## 2024-04-29 NOTE — Progress Notes (Signed)
 [facility]  Telephone:(336) [phone] Fax:(336) [phone]  ID: Jerry Huynh Chol OB: [birth year]  MR#: [id number]  RDW#:[id number]  Patient Care Team: Valora Lynwood FALCON, MD as PCP - General (Family Medicine) Jacobo Evalene PARAS, MD as Consulting Physician (Oncology)  CHIEF COMPLAINT: MGUS.  INTERVAL HISTORY: Patient returns to clinic today for repeat laboratory work and further evaluation.  He continues to feel well and remains asymptomatic.  He has no neurologic complaints.  He denies any recent fevers or illnesses.  He has a good appetite and denies weight loss.  He has no chest pain, shortness of breath, cough, or hemoptysis.  He denies any nausea, vomiting, constipation, or diarrhea.  He has no urinary complaints.  Patient offers no specific complaints today.  REVIEW OF SYSTEMS:   Review of Systems  Constitutional: Negative.  Negative for fever, malaise/fatigue and weight loss.  Respiratory: Negative.  Negative for cough, hemoptysis and shortness of breath.   Cardiovascular: Negative.  Negative for chest pain and leg swelling.  Gastrointestinal: Negative.  Negative for abdominal pain.  Genitourinary: Negative.  Negative for dysuria.  Musculoskeletal: Negative.  Negative for back pain.  Skin: Negative.  Negative for rash.  Neurological: Negative.  Negative for dizziness, focal weakness, weakness and headaches.  Psychiatric/Behavioral: Negative.  The patient is not nervous/anxious.     As per HPI. Otherwise, a complete review of systems is negative.  PAST MEDICAL HISTORY: Past Medical History:  Diagnosis Date   Basal cell carcinoma 03/26/2014   Left upper back post. shoulder inf. Superficial.   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 shoulder sup. SCCis arising in LK.    PAST SURGICAL HISTORY: Past Surgical History:  Procedure Laterality Date   APPENDECTOMY     COLONOSCOPY     COLONOSCOPY N/A 04/25/2024   Procedure: COLONOSCOPY;  Surgeon: Maryruth Ole DASEN, MD;  Location: [facility] ENDOSCOPY;  Service: Endoscopy;  Laterality: N/A;  DM   COLONOSCOPY WITH PROPOFOL  N/A 11/10/2016   Procedure: COLONOSCOPY WITH PROPOFOL ;  Surgeon: Viktoria Lamar DASEN, MD;  Location: [facility] ENDOSCOPY;  Service: Endoscopy;  Laterality: N/A;   COLONOSCOPY WITH PROPOFOL  N/A 07/07/2020   Procedure: COLONOSCOPY WITH PROPOFOL ;  Surgeon: Toledo, Ladell POUR, MD;  Location: [facility] ENDOSCOPY;  Service: Gastroenterology;  Laterality: N/A;  DM   ESOPHAGOGASTRODUODENOSCOPY (EGD) WITH PROPOFOL  N/A 01/09/2022   Procedure: ESOPHAGOGASTRODUODENOSCOPY (EGD) WITH PROPOFOL ;  Surgeon: Maryruth Ole DASEN, MD;  Location: [facility] ENDOSCOPY;  Service: Endoscopy;  Laterality: N/A;   HERNIA REPAIR     POLYPECTOMY  04/25/2024   Procedure: POLYPECTOMY, INTESTINE;  Surgeon: Maryruth Ole DASEN, MD;  Location: [facility] ENDOSCOPY;  Service: Endoscopy;;    FAMILY HISTORY: Family History  Problem Relation Age of Onset   Aneurysm Mother    Colon cancer Father    Healthy Sister    Healthy Brother    Healthy Brother    Alzheimer's disease Maternal Grandmother    Aneurysm Paternal Grandmother  Kidney failure Paternal Grandfather     ADVANCED DIRECTIVES (Y/N):  N  HEALTH MAINTENANCE: Social History   Tobacco Use   Smoking status: Every Day    Current packs/day: 0.50    Types: Cigarettes   Smokeless tobacco: Never  Vaping Use   Vaping status: Never Used  Substance Use Topics   Alcohol use: Not Currently    Comment:  occassional   Drug use: No     Colonoscopy:  PAP:  Bone density:  Lipid panel:  Allergies  Allergen Reactions   Sulfa Antibiotics Anaphylaxis   Codeine Nausea And Vomiting    Current Outpatient Medications  Medication Sig Dispense Refill   cetirizine (ZYRTEC) 10 MG tablet Take 10 mg by mouth daily.     Cholecalciferol (VITAMIN D3) 50 MCG (2000 UT) capsule Take 2,000 Units by mouth daily.     cyclobenzaprine (FLEXERIL) 10 MG tablet cyclobenzaprine 10 mg tablet  TAKE 1 TABLET BY MOUTH THREE TIMES DAILY     escitalopram (LEXAPRO) 10 MG tablet Take 10 mg by mouth daily.     gabapentin (NEURONTIN) 100 MG capsule Take 100 mg by mouth 3 (three) times daily.     glipiZIDE (GLUCOTROL XL) 5 MG 24 hr tablet Take by mouth.     lisinopril-hydrochlorothiazide (PRINZIDE,ZESTORETIC) 20-25 MG tablet Take 1 tablet by mouth daily.     meloxicam (MOBIC) 15 MG tablet Take 1 tablet by mouth daily.     metoprolol succinate (TOPROL-XL) 25 MG 24 hr tablet Take 25 mg by mouth daily.     Multiple Vitamin (MULTIVITAMIN) tablet Take 1 tablet by mouth daily.     Omega-3 1000 MG CAPS Take 1 capsule by mouth daily.     omeprazole (PRILOSEC) 20 MG capsule Take 20 mg by mouth daily.     pioglitazone (ACTOS) 45 MG tablet Take 45 mg by mouth daily.     rosuvastatin (CRESTOR) 5 MG tablet Take 5 mg by mouth.     fluticasone (FLONASE) 50 MCG/ACT nasal spray Place 2 sprays into the nose. (Patient not taking: Reported on 04/29/2024)     Na Sulfate-K Sulfate-Mg Sulfate concentrate (SUPREP) 17.5-3.13-1.6 GM/177ML SOLN See admin instructions.     terbinafine  (LAMISIL ) 250 MG tablet Take 1 tablet (250 mg total) by mouth daily. (Patient not taking: Reported on 04/29/2024) 90 tablet 0   No current facility-administered medications for this visit.    OBJECTIVE: Vitals:   04/29/24 1004  BP: 124/88  Pulse: 64  Resp: 16  Temp: (!) 97.2 F (36.2 C)  SpO2: 96%     Body mass index is 49.55 kg/m.    ECOG FS:0 -  Asymptomatic  General: Well-developed, well-nourished, no acute distress. Eyes: Pink conjunctiva, anicteric sclera. HEENT: Normocephalic, moist mucous membranes. Lungs: No audible wheezing or coughing. Heart: Regular rate and rhythm. Abdomen: Soft, nontender, no obvious distention. Musculoskeletal: No edema, cyanosis, or clubbing. Neuro: Alert, answering all questions appropriately. Cranial nerves grossly intact. Skin: No rashes or petechiae noted. Psych: Normal affect.  LAB RESULTS:  Lab Results  Component Value Date   NA 137 04/21/2024   K 4.8 04/21/2024   CL 102 04/21/2024   CO2 25 04/21/2024   GLUCOSE 131 (H) 04/21/2024   BUN 24 (H) 04/21/2024   CREATININE 1.20 04/21/2024   CALCIUM 9.7 04/21/2024   PROT 7.9 07/16/2021   ALBUMIN 4.4 07/16/2021   AST 25 07/16/2021   ALT 30 07/16/2021   ALKPHOS 76 07/16/2021   BILITOT 0.5 07/16/2021   GFRNONAA >60  04/21/2024    Lab Results  Component Value Date   WBC 5.6 04/21/2024   NEUTROABS 3.3 04/21/2024   HGB 14.0 04/21/2024   HCT 41.3 04/21/2024   MCV 93.9 04/21/2024   PLT 197 04/21/2024     STUDIES: No results found.   ASSESSMENT: MGUS.  PLAN:    MGUS: Patient's M spike remains stable and unchanged since August 2025 at 0.3.  Immunoglobulins are within normal limits.  Since August 2025 he has also had a mildly elevated kappa free light chain between 31.3 and 32.2.  He continues to have no evidence of endorgan damage.  No intervention is needed.  Patient does not require a bone marrow biopsy.  Return to clinic in 6 months with repeat laboratory work and evaluation by APP.    I spent a total of 20 minutes reviewing chart data, face-to-face evaluation with the patient, counseling and coordination of care as detailed above.    Patient expressed understanding and was in agreement with this plan. He also understands that He can call clinic at any time with any questions, concerns, or complaints.    Evalene JINNY Reusing, MD    04/29/2024 11:35 PM

## 2024-11-04 ENCOUNTER — Inpatient Hospital Stay

## 2024-11-11 ENCOUNTER — Inpatient Hospital Stay: Admitting: Nurse Practitioner
